# Patient Record
Sex: Female | Born: 1945
Health system: Southern US, Community
[De-identification: ages and names within clinical notes are randomized; demographics above are authoritative.]

---

## 1998-06-12 ENCOUNTER — Ambulatory Visit (HOSPITAL_COMMUNITY): Admission: RE | Admit: 1998-06-12 | Discharge: 1998-06-12 | Payer: Self-pay | Admitting: Obstetrics and Gynecology

## 1998-06-13 ENCOUNTER — Encounter: Payer: Self-pay | Admitting: Obstetrics and Gynecology

## 1998-09-11 ENCOUNTER — Other Ambulatory Visit: Admission: RE | Admit: 1998-09-11 | Discharge: 1998-09-11 | Payer: Self-pay | Admitting: Obstetrics and Gynecology

## 1999-03-22 ENCOUNTER — Ambulatory Visit (HOSPITAL_COMMUNITY): Admission: RE | Admit: 1999-03-22 | Discharge: 1999-03-22 | Payer: Self-pay | Admitting: Gastroenterology

## 1999-06-14 ENCOUNTER — Ambulatory Visit (HOSPITAL_COMMUNITY): Admission: RE | Admit: 1999-06-14 | Discharge: 1999-06-14 | Payer: Self-pay | Admitting: Obstetrics and Gynecology

## 1999-06-14 ENCOUNTER — Encounter: Payer: Self-pay | Admitting: Obstetrics and Gynecology

## 1999-09-17 ENCOUNTER — Other Ambulatory Visit: Admission: RE | Admit: 1999-09-17 | Discharge: 1999-09-17 | Payer: Self-pay | Admitting: Obstetrics and Gynecology

## 2000-06-25 ENCOUNTER — Ambulatory Visit (HOSPITAL_COMMUNITY): Admission: RE | Admit: 2000-06-25 | Discharge: 2000-06-25 | Payer: Self-pay | Admitting: Obstetrics and Gynecology

## 2000-06-25 ENCOUNTER — Encounter: Payer: Self-pay | Admitting: Obstetrics and Gynecology

## 2000-09-18 ENCOUNTER — Other Ambulatory Visit: Admission: RE | Admit: 2000-09-18 | Discharge: 2000-09-18 | Payer: Self-pay | Admitting: Obstetrics and Gynecology

## 2001-07-03 ENCOUNTER — Encounter: Payer: Self-pay | Admitting: Obstetrics and Gynecology

## 2001-07-03 ENCOUNTER — Ambulatory Visit (HOSPITAL_COMMUNITY): Admission: RE | Admit: 2001-07-03 | Discharge: 2001-07-03 | Payer: Self-pay | Admitting: Obstetrics and Gynecology

## 2001-09-28 ENCOUNTER — Other Ambulatory Visit: Admission: RE | Admit: 2001-09-28 | Discharge: 2001-09-28 | Payer: Self-pay | Admitting: Obstetrics and Gynecology

## 2002-07-05 ENCOUNTER — Ambulatory Visit (HOSPITAL_COMMUNITY): Admission: RE | Admit: 2002-07-05 | Discharge: 2002-07-05 | Payer: Self-pay | Admitting: Obstetrics and Gynecology

## 2002-07-05 ENCOUNTER — Encounter: Payer: Self-pay | Admitting: Obstetrics and Gynecology

## 2003-07-08 ENCOUNTER — Encounter: Payer: Self-pay | Admitting: Internal Medicine

## 2003-07-08 ENCOUNTER — Ambulatory Visit (HOSPITAL_COMMUNITY): Admission: RE | Admit: 2003-07-08 | Discharge: 2003-07-08 | Payer: Self-pay | Admitting: Internal Medicine

## 2004-02-10 ENCOUNTER — Ambulatory Visit (HOSPITAL_COMMUNITY): Admission: RE | Admit: 2004-02-10 | Discharge: 2004-02-10 | Payer: Self-pay | Admitting: Gastroenterology

## 2004-07-09 ENCOUNTER — Ambulatory Visit (HOSPITAL_COMMUNITY): Admission: RE | Admit: 2004-07-09 | Discharge: 2004-07-09 | Payer: Self-pay | Admitting: Internal Medicine

## 2005-07-10 ENCOUNTER — Ambulatory Visit (HOSPITAL_COMMUNITY): Admission: RE | Admit: 2005-07-10 | Discharge: 2005-07-10 | Payer: Self-pay | Admitting: Internal Medicine

## 2005-09-01 ENCOUNTER — Emergency Department (HOSPITAL_COMMUNITY): Admission: EM | Admit: 2005-09-01 | Discharge: 2005-09-02 | Payer: Self-pay | Admitting: Emergency Medicine

## 2006-07-11 ENCOUNTER — Ambulatory Visit (HOSPITAL_COMMUNITY): Admission: RE | Admit: 2006-07-11 | Discharge: 2006-07-11 | Payer: Self-pay | Admitting: Internal Medicine

## 2007-07-20 ENCOUNTER — Ambulatory Visit (HOSPITAL_COMMUNITY): Admission: RE | Admit: 2007-07-20 | Discharge: 2007-07-20 | Payer: Self-pay | Admitting: Internal Medicine

## 2008-07-21 ENCOUNTER — Ambulatory Visit (HOSPITAL_COMMUNITY): Admission: RE | Admit: 2008-07-21 | Discharge: 2008-07-21 | Payer: Self-pay | Admitting: Internal Medicine

## 2009-07-24 ENCOUNTER — Ambulatory Visit (HOSPITAL_COMMUNITY): Admission: RE | Admit: 2009-07-24 | Discharge: 2009-07-24 | Payer: Self-pay | Admitting: Internal Medicine

## 2010-07-26 ENCOUNTER — Ambulatory Visit (HOSPITAL_COMMUNITY): Admission: RE | Admit: 2010-07-26 | Discharge: 2010-07-26 | Payer: Self-pay | Admitting: Internal Medicine

## 2010-11-10 ENCOUNTER — Encounter: Payer: Self-pay | Admitting: Internal Medicine

## 2011-03-08 NOTE — Op Note (Signed)
NAME:  Dawn Wang, Dawn Wang                          ACCOUNT NO.:  [id number]   MEDICAL RECORD NO.:  [id number]                   PATIENT TYPE:  AMB   LOCATION:  ENDO                                 FACILITY:  [facility]   PHYSICIAN:  Anselmo Rod, M.D.               DATE OF BIRTH:  [birth year]   DATE OF PROCEDURE:  02/10/2004  DATE OF DISCHARGE:                                 OPERATIVE REPORT   PROCEDURE PERFORMED:  Screening colonoscopy.   ENDOSCOPIST:  Anselmo Rod, M.D.   INSTRUMENT USED:  Olympus video colonoscope.   INDICATIONS FOR PROCEDURE:  This is a 65 year old African-American female  underwent a repeat colonoscopy after five years for family history of colon  cancer, rule out colonic polyps or masses.   PREPROCEDURE PREPARATION:  Informed consent was procured from the patient.  The patient fasted for 8 hours prior to procedure, and prepped with a bottle  of magnesium citrate and a gallon of GoLYTELY the night prior to the  procedure.   PREPROCEDURE PHYSICAL:  The patient had stable vital signs.  Neck supple.  Chest clear to auscultation.  S1, S2 regular.  Abdomen soft with normal  bowel sounds.   DESCRIPTION OF PROCEDURE:  The patient was placed in the left lateral  decubitus position, sedated with 100 mg of Demerol and 10 mg of Versed  intravenously.  Once the patient was adequately sedated and maintained on  low flow oxygen and continuous cardiac monitoring, the Olympus video  colonoscope was advanced from the rectum to the cecum with difficulty.  The  patient's position was changed on the left lateral to the supine and the  right lateral position with application of abdominal pressure to reach the  cecum.  The appendiceal orifice and ileocecal valve were visualized and  photographed.  No masses or polyps were identified.  Small internal  hemorrhoids were seen on retroflexion in the rectum.  The patient tolerated  the procedure well without immediate complications.   IMPRESSION:  1. Very tortuous colon, no masses or polyps seen.  2. Small internal hemorrhoids seen on retroflexion.   RECOMMENDATIONS:  1. Continue high fiber diet with liberal fluid intake.  2. Repeat CRC screening in the next five years unless the patient develops     any abnormal symptoms in the interim.  3. Outpatient followup as need arises in the future.                                               Anselmo Rod, M.D.    JNM/MEDQ  D:  02/10/2004  T:  02/11/2004  Job:  [id number]   cc:   Merlene Laughter. Renae Gloss, M.D.  [address]  [address]  [place]  [place] [address]  Fax: [phone]

## 2011-06-21 ENCOUNTER — Other Ambulatory Visit (HOSPITAL_COMMUNITY): Payer: Self-pay | Admitting: Internal Medicine

## 2011-06-21 DIAGNOSIS — Z1231 Encounter for screening mammogram for malignant neoplasm of breast: Secondary | ICD-10-CM

## 2011-08-02 ENCOUNTER — Ambulatory Visit (HOSPITAL_COMMUNITY)
Admission: RE | Admit: 2011-08-02 | Discharge: 2011-08-02 | Disposition: A | Discharge status: Home / Self Care | Payer: BC Managed Care – PPO | Source: Ambulatory Visit | Attending: Internal Medicine | Admitting: Internal Medicine

## 2011-08-02 DIAGNOSIS — Z1231 Encounter for screening mammogram for malignant neoplasm of breast: Secondary | ICD-10-CM | POA: Insufficient documentation

## 2011-12-14 ENCOUNTER — Telehealth: Payer: Self-pay | Admitting: Family Medicine

## 2011-12-14 ENCOUNTER — Ambulatory Visit (INDEPENDENT_AMBULATORY_CARE_PROVIDER_SITE_OTHER): Payer: BC Managed Care – PPO | Admitting: Family Medicine

## 2011-12-14 DIAGNOSIS — J069 Acute upper respiratory infection, unspecified: Secondary | ICD-10-CM

## 2011-12-14 DIAGNOSIS — R05 Cough: Secondary | ICD-10-CM

## 2011-12-14 DIAGNOSIS — J45909 Unspecified asthma, uncomplicated: Secondary | ICD-10-CM

## 2011-12-14 DIAGNOSIS — R053 Chronic cough: Secondary | ICD-10-CM

## 2011-12-14 MED ORDER — ALBUTEROL SULFATE (2.5 MG/3ML) 0.083% IN NEBU
2.5000 mg | INHALATION_SOLUTION | Freq: Once | RESPIRATORY_TRACT | Status: AC
  Administered 2011-12-14: 2.5 mg via RESPIRATORY_TRACT

## 2011-12-14 MED ORDER — ALBUTEROL SULFATE HFA 108 (90 BASE) MCG/ACT IN AERS: 2.0000 | INHALATION_SPRAY | Freq: Four times a day (QID) | RESPIRATORY_TRACT | Status: DC | PRN

## 2011-12-14 MED ORDER — AZITHROMYCIN 1 G PO PACK: PACK | ORAL | Status: DC

## 2011-12-14 MED ORDER — HYDROCODONE-HOMATROPINE 5-1.5 MG/5ML PO SYRP: 5.0000 mL | ORAL_SOLUTION | Freq: Four times a day (QID) | ORAL | Status: AC | PRN

## 2011-12-14 MED ORDER — PREDNISONE 20 MG PO TABS: ORAL_TABLET | ORAL | Status: DC

## 2011-12-14 NOTE — Telephone Encounter (Signed)
Hycodan rx not given to patient, called into [facility] and Z pak rx corrected (was entered as powder).

## 2011-12-14 NOTE — Patient Instructions (Signed)
Use the medicines as instructed. If you're getting worse at anytime, or just not doing better, come back

## 2011-12-14 NOTE — Progress Notes (Signed)
Subjective: Patient is here complaining of having a respiratory tract infection for 2 weeks. Now she just continues to cough. She has rattling in her chest at night. She doesn't feel good. She is short of breath. He does cough up some phlegm. She is not running as much her nose now but she does some.  Objective: TMs are normal. Nose slightly congested. Throat clear. Neck supple without significant nodes. Chest has scattered rhonchi throughout both lungs and a and expiratory wheeze with poor air exchange on forced expiration.  Peak flow was done and her predicted is about 390, the best was 260.  Assessment: Acute asthmatic bronchitis Upper chart for infection  Plan: Will give handheld treatments while here in [place] she does. We'll plan to give her nebulizer and antibiotic therapy as well as cough syrup. We'll consider prednisone depending on how the nebulizer does.  Repeat peak flow was improved but not great. She still has some congestion. There are scattered rhonchi. Prescriptions

## 2012-06-29 ENCOUNTER — Other Ambulatory Visit (HOSPITAL_COMMUNITY): Payer: Self-pay | Admitting: Internal Medicine

## 2012-06-29 DIAGNOSIS — Z1231 Encounter for screening mammogram for malignant neoplasm of breast: Secondary | ICD-10-CM

## 2012-08-03 ENCOUNTER — Ambulatory Visit (HOSPITAL_COMMUNITY)
Admission: RE | Admit: 2012-08-03 | Discharge: 2012-08-03 | Disposition: A | Discharge status: Home / Self Care | Payer: BC Managed Care – PPO | Source: Ambulatory Visit | Attending: Internal Medicine | Admitting: Internal Medicine

## 2012-08-03 DIAGNOSIS — Z1231 Encounter for screening mammogram for malignant neoplasm of breast: Secondary | ICD-10-CM | POA: Insufficient documentation

## 2013-07-06 ENCOUNTER — Other Ambulatory Visit (HOSPITAL_COMMUNITY): Payer: Self-pay | Admitting: Internal Medicine

## 2013-07-06 DIAGNOSIS — Z1231 Encounter for screening mammogram for malignant neoplasm of breast: Secondary | ICD-10-CM

## 2013-08-04 ENCOUNTER — Ambulatory Visit (HOSPITAL_COMMUNITY)
Admission: RE | Admit: 2013-08-04 | Discharge: 2013-08-04 | Disposition: A | Discharge status: Home / Self Care | Payer: BC Managed Care – PPO | Source: Ambulatory Visit | Attending: Internal Medicine | Admitting: Internal Medicine

## 2013-08-04 DIAGNOSIS — Z1231 Encounter for screening mammogram for malignant neoplasm of breast: Secondary | ICD-10-CM | POA: Insufficient documentation

## 2014-08-11 ENCOUNTER — Other Ambulatory Visit (HOSPITAL_COMMUNITY): Payer: Self-pay | Admitting: Internal Medicine

## 2014-08-11 DIAGNOSIS — Z1231 Encounter for screening mammogram for malignant neoplasm of breast: Secondary | ICD-10-CM

## 2014-08-18 DIAGNOSIS — Z800 Family history of malignant neoplasm of digestive organs: Secondary | ICD-10-CM | POA: Diagnosis not present

## 2014-08-18 DIAGNOSIS — Z1211 Encounter for screening for malignant neoplasm of colon: Secondary | ICD-10-CM | POA: Diagnosis not present

## 2014-09-06 ENCOUNTER — Ambulatory Visit (HOSPITAL_COMMUNITY)
Admission: RE | Admit: 2014-09-06 | Discharge: 2014-09-06 | Disposition: A | Discharge status: Home / Self Care | Payer: Medicare Other | Source: Ambulatory Visit | Attending: Internal Medicine | Admitting: Internal Medicine

## 2014-09-06 DIAGNOSIS — Z1231 Encounter for screening mammogram for malignant neoplasm of breast: Secondary | ICD-10-CM | POA: Diagnosis not present

## 2014-09-13 DIAGNOSIS — Z23 Encounter for immunization: Secondary | ICD-10-CM | POA: Diagnosis not present

## 2014-10-24 DIAGNOSIS — Z1211 Encounter for screening for malignant neoplasm of colon: Secondary | ICD-10-CM | POA: Diagnosis not present

## 2014-10-24 DIAGNOSIS — D127 Benign neoplasm of rectosigmoid junction: Secondary | ICD-10-CM | POA: Diagnosis not present

## 2014-10-24 DIAGNOSIS — K621 Rectal polyp: Secondary | ICD-10-CM | POA: Diagnosis not present

## 2014-10-24 DIAGNOSIS — K635 Polyp of colon: Secondary | ICD-10-CM | POA: Diagnosis not present

## 2014-10-24 DIAGNOSIS — Z800 Family history of malignant neoplasm of digestive organs: Secondary | ICD-10-CM | POA: Diagnosis not present

## 2015-05-26 DIAGNOSIS — E780 Pure hypercholesterolemia: Secondary | ICD-10-CM | POA: Diagnosis not present

## 2015-05-26 DIAGNOSIS — Z0000 Encounter for general adult medical examination without abnormal findings: Secondary | ICD-10-CM | POA: Diagnosis not present

## 2015-05-26 DIAGNOSIS — E559 Vitamin D deficiency, unspecified: Secondary | ICD-10-CM | POA: Diagnosis not present

## 2015-05-26 DIAGNOSIS — R7309 Other abnormal glucose: Secondary | ICD-10-CM | POA: Diagnosis not present

## 2015-05-26 DIAGNOSIS — Z1389 Encounter for screening for other disorder: Secondary | ICD-10-CM | POA: Diagnosis not present

## 2015-05-29 ENCOUNTER — Ambulatory Visit
Admission: RE | Admit: 2015-05-29 | Discharge: 2015-05-29 | Disposition: A | Discharge status: Home / Self Care | Payer: Medicare Other | Source: Ambulatory Visit | Attending: Family | Admitting: Family

## 2015-05-29 ENCOUNTER — Other Ambulatory Visit: Payer: Self-pay | Admitting: Family

## 2015-05-29 DIAGNOSIS — M19011 Primary osteoarthritis, right shoulder: Secondary | ICD-10-CM | POA: Diagnosis not present

## 2015-05-29 DIAGNOSIS — M85411 Solitary bone cyst, right shoulder: Secondary | ICD-10-CM | POA: Diagnosis not present

## 2015-05-29 DIAGNOSIS — M85611 Other cyst of bone, right shoulder: Secondary | ICD-10-CM

## 2015-07-26 DIAGNOSIS — Z23 Encounter for immunization: Secondary | ICD-10-CM | POA: Diagnosis not present

## 2015-08-08 ENCOUNTER — Other Ambulatory Visit: Payer: Self-pay

## 2015-08-08 DIAGNOSIS — Z1231 Encounter for screening mammogram for malignant neoplasm of breast: Secondary | ICD-10-CM

## 2015-08-15 ENCOUNTER — Ambulatory Visit (INDEPENDENT_AMBULATORY_CARE_PROVIDER_SITE_OTHER): Payer: Medicare Other

## 2015-08-15 ENCOUNTER — Ambulatory Visit (INDEPENDENT_AMBULATORY_CARE_PROVIDER_SITE_OTHER): Payer: Medicare Other | Admitting: Physician Assistant

## 2015-08-15 VITALS — BP 146/80 | HR 85 | Temp 98.4°F | Resp 16 | Ht 61.0 in | Wt 160.0 lb

## 2015-08-15 DIAGNOSIS — R062 Wheezing: Secondary | ICD-10-CM

## 2015-08-15 DIAGNOSIS — R05 Cough: Secondary | ICD-10-CM

## 2015-08-15 DIAGNOSIS — J189 Pneumonia, unspecified organism: Secondary | ICD-10-CM | POA: Diagnosis not present

## 2015-08-15 DIAGNOSIS — R059 Cough, unspecified: Secondary | ICD-10-CM

## 2015-08-15 DIAGNOSIS — J181 Lobar pneumonia, unspecified organism: Secondary | ICD-10-CM

## 2015-08-15 LAB — POCT CBC
Granulocyte percent: 42.9 %G (ref 37–80)
HCT, POC: 36.9 % — AB (ref 37.7–47.9)
Hemoglobin: 12.3 g/dL (ref 12.2–16.2)
Lymph, poc: 3.3 (ref 0.6–3.4)
MCH, POC: 30.3 pg (ref 27–31.2)
MCHC: 33.2 g/dL (ref 31.8–35.4)
MCV: 91.3 fL (ref 80–97)
MID (cbc): 0.7 (ref 0–0.9)
MPV: 8.3 fL (ref 0–99.8)
POC Granulocyte: 3 (ref 2–6.9)
POC LYMPH PERCENT: 46.9 %L (ref 10–50)
POC MID %: 10.2 %M (ref 0–12)
Platelet Count, POC: 178 10*3/uL (ref 142–424)
RBC: 4.05 M/uL (ref 4.04–5.48)
RDW, POC: 13.7 %
WBC: 7.1 10*3/uL (ref 4.6–10.2)

## 2015-08-15 MED ORDER — ALBUTEROL SULFATE HFA 108 (90 BASE) MCG/ACT IN AERS: 2.0000 | INHALATION_SPRAY | Freq: Four times a day (QID) | RESPIRATORY_TRACT | Status: DC | PRN

## 2015-08-15 MED ORDER — GUAIFENESIN ER 1200 MG PO TB12: 1.0000 | ORAL_TABLET | Freq: Two times a day (BID) | ORAL | Status: AC

## 2015-08-15 MED ORDER — AZITHROMYCIN 250 MG PO TABS: ORAL_TABLET | ORAL | Status: AC

## 2015-08-15 MED ORDER — ALBUTEROL SULFATE (2.5 MG/3ML) 0.083% IN NEBU
2.5000 mg | INHALATION_SOLUTION | Freq: Once | RESPIRATORY_TRACT | Status: AC
  Administered 2015-08-15: 2.5 mg via RESPIRATORY_TRACT

## 2015-08-15 NOTE — Progress Notes (Signed)
Dawn Wang  MRN: [id number] DOB: [birth year]  Subjective:  Pt presents to clinic with cold symptoms for the last week and then over the last 3 days she has developed a cough that is coming from her throat that feels like a tickle that has some yellow sputum at times when she coughs but other times it is dry.  She is having no SOB or wheezing but she does think that when he takes a deep breath she is starting her cough.  Home treatment - cold preps and cough syrups - not much help  There are no active problems to display for this patient.   No current outpatient prescriptions on file prior to visit.   No current facility-administered medications on file prior to visit.    No Known Allergies  Review of Systems  Constitutional: Negative for fever and chills.  HENT: Positive for congestion (nasal - mild during the day and worse at night -) and postnasal drip. Rhinorrhea: clear.   Respiratory: Positive for cough (yellow). Negative for shortness of breath and wheezing.        No h/o asthma, nonsmoker  Gastrointestinal: Negative for nausea, vomiting and diarrhea.   Objective:  BP 146/80 mmHg  Pulse 85  Temp(Src) 98.4 F (36.9 C) (Oral)  Resp 16  Ht 5\' 1"  (1.549 m)  Wt 160 lb (72.576 kg)  BMI 30.25 kg/m2  SpO2 98%  Physical Exam  Constitutional: She is oriented to person, place, and time and well-developed, well-nourished, and in no distress.  HENT:  Head: Normocephalic and atraumatic.  Right Ear: Hearing, tympanic membrane, external ear and ear canal normal.  Left Ear: Hearing, tympanic membrane, external ear and ear canal normal.  Nose: Nose normal.  Mouth/Throat: Uvula is midline, oropharynx is clear and moist and mucous membranes are normal.  Eyes: Conjunctivae are normal.  Neck: Normal range of motion.  Cardiovascular: Normal rate, regular rhythm and normal heart sounds.   No murmur heard. Pulmonary/Chest: Effort normal. She has wheezes (rhonchi end inspiratory and  last 50% of expiratory and end expiratory wheezing -- after neb the rhonchi and wheezing were still present at the ends of both breathing cycles -  worse on the left lower aspect of lung exam).  Neurological: She is alert and oriented to person, place, and time. Gait normal.  Skin: Skin is warm and dry.  Psychiatric: Mood, memory, affect and judgment normal.  Vitals reviewed.  [facility] reading (PRIMARY) by  Dr. Elder Cyphers.  ? Infiltrate right lingular vs RML, Left mediastinal density  Results for orders placed or performed in visit on 08/15/15  POCT CBC  Result Value Ref Range   WBC 7.1 4.6 - 10.2 K/uL   Lymph, poc 3.3 0.6 - 3.4   POC LYMPH PERCENT 46.9 10 - 50 %L   MID (cbc) 0.7 0 - 0.9   POC MID % 10.2 0 - 12 %M   POC Granulocyte 3.0 2 - 6.9   Granulocyte percent 42.9 37 - 80 %G   RBC 4.05 4.04 - 5.48 M/uL   Hemoglobin 12.3 12.2 - 16.2 g/dL   HCT, POC 36.9 (A) 37.7 - 47.9 %   MCV 91.3 80 - 97 fL   MCH, POC 30.3 27 - 31.2 pg   MCHC 33.2 31.8 - 35.4 g/dL   RDW, POC 13.7 %   Platelet Count, POC 178 142 - 424 K/uL   MPV 8.3 0 - 99.8 fL    Assessment and Plan :  Wheezing -  Plan: albuterol (PROVENTIL) (2.5 MG/3ML) 0.083% nebulizer solution 2.5 mg, albuterol (PROVENTIL HFA;VENTOLIN HFA) 108 (90 BASE) MCG/ACT inhaler  Cough - Plan: albuterol (PROVENTIL) (2.5 MG/3ML) 0.083% nebulizer solution 2.5 mg, DG Chest 2 View, POCT CBC  RML pneumonia - Plan: azithromycin (ZITHROMAX Z-PAK) 250 MG tablet, Guaifenesin (MUCINEX MAXIMUM STRENGTH) 1200 MG TB12   Pt will recheck in 3-4 weeks for xray to make sure the PNA is resolved.  She will f/u sooner if she has problems.  D/w Dr Elder Cyphers  Windell Hummingbird PA-C  [facility] 08/15/2015 4:53 PM

## 2015-08-20 ENCOUNTER — Telehealth: Payer: Self-pay | Admitting: Physician Assistant

## 2015-08-20 NOTE — Telephone Encounter (Signed)
Patient would like to know if it is ok for her to go outside. Patient has been inside of home since Tuesday. Patient states she have walking pneumonia.

## 2015-08-21 NOTE — Telephone Encounter (Signed)
Pt notified that this is ok. She has taken all of the abx and is no longer contagious.

## 2015-08-28 ENCOUNTER — Encounter: Payer: Self-pay | Admitting: Family Medicine

## 2015-08-28 ENCOUNTER — Ambulatory Visit (INDEPENDENT_AMBULATORY_CARE_PROVIDER_SITE_OTHER): Payer: Medicare Other

## 2015-08-28 ENCOUNTER — Ambulatory Visit (INDEPENDENT_AMBULATORY_CARE_PROVIDER_SITE_OTHER): Payer: Medicare Other | Admitting: Family Medicine

## 2015-08-28 VITALS — BP 159/73 | HR 62 | Temp 98.1°F | Resp 16 | Ht 62.0 in | Wt 154.0 lb

## 2015-08-28 DIAGNOSIS — R05 Cough: Secondary | ICD-10-CM

## 2015-08-28 DIAGNOSIS — R059 Cough, unspecified: Secondary | ICD-10-CM

## 2015-08-28 DIAGNOSIS — R062 Wheezing: Secondary | ICD-10-CM

## 2015-08-28 DIAGNOSIS — R918 Other nonspecific abnormal finding of lung field: Secondary | ICD-10-CM

## 2015-08-28 MED ORDER — ALBUTEROL SULFATE (2.5 MG/3ML) 0.083% IN NEBU
2.5000 mg | INHALATION_SOLUTION | Freq: Once | RESPIRATORY_TRACT | Status: AC
  Administered 2015-08-28: 2.5 mg via RESPIRATORY_TRACT

## 2015-08-28 NOTE — Progress Notes (Signed)
Subjective:    Patient ID: Dawn Wang, female    DOB: 08/23/1946, 69 y.o.   MRN: [id number]  HPI This is a pleasant 69 yo female who presents today for follow up of recently diagnosed pneumonia. She was seen 08/15/15 and had CXR suggestive of pneumonia. She was given Zpack, albuterol inhaler and mucinex 12 hour. She has gotten progressively better following antibiotic, but continues to have intermittent cough. Energy level improving. Cough less frequent. She had a good night sleep last night for the first time in several weeks. Has been using albuterol inhaler 3x day with good relief of cough. Has not used this morning.   No past medical history on file. No past surgical history on file. No family history on file. Social History  Substance Use Topics  . Smoking status: Never Smoker   . Smokeless tobacco: None  . Alcohol Use: None   Review of Systems No fever or chills, no ear pain, no sore throat, continues to wheeze, but it is less frequent and relieved with albuterol. SOB with prolonged talking, able to do all normal household activities.     Objective:   Physical Exam  Constitutional: She is oriented to person, place, and time. She appears well-developed and well-nourished. No distress.  HENT:  Head: Normocephalic and atraumatic.  Right Ear: External ear normal.  Left Ear: External ear normal.  Nose: Nose normal.  Mouth/Throat: Oropharynx is clear and moist.  Eyes: Conjunctivae are normal. Pupils are equal, round, and reactive to light.  Neck: Normal range of motion. Neck supple.  Cardiovascular: Normal rate, regular rhythm and normal heart sounds.   Pulmonary/Chest: Effort normal.  Scattered rhonchi throughout anterior/posterior with high pitched squeaking. Occasional nonproductive cough.   Musculoskeletal: Normal range of motion.  Lymphadenopathy:    She has no cervical adenopathy.  Neurological: She is alert and oriented to person, place, and time.  Skin: Skin is warm  and dry. She is not diaphoretic.  Psychiatric: She has a normal mood and affect. Her behavior is normal. Thought content normal.  Vitals reviewed.  BP 159/73 mmHg  Pulse 62  Temp(Src) 98.1 F (36.7 C)  Resp 16  Ht 5\' 2"  (1.575 m)  Wt 154 lb (69.854 kg)  BMI 28.16 kg/m2  SpO2 98% Patient given albuterol neb in office with good results- subjective improvement and loosing of secretions with clear sputum production. Lungs with good air movement throughout and resolution of abnormal lung sounds.   [facility] reading (PRIMARY) by  Dr. Tamala Julian- CXR- right middle lobe pneumonia slightly improved. No additional abnormal findings.     Assessment & Plan:  Discussed with Dr. Tamala Julian who reviewed CXR.  1. Cough - albuterol (PROVENTIL) (2.5 MG/3ML) 0.083% nebulizer solution 2.5 mg; Take 3 mLs (2.5 mg total) by nebulization once. - DG Chest 2 View; Future - this has been slowly improving since finishing antibiotic  2. Wheeze - albuterol (PROVENTIL) (2.5 MG/3ML) 0.083% nebulizer solution 2.5 mg; Take 3 mLs (2.5 mg total) by nebulization once. - DG Chest 2 View; Future  3. Lung field abnormal finding on examination - albuterol (PROVENTIL) (2.5 MG/3ML) 0.083% nebulizer solution 2.5 mg; Take 3 mLs (2.5 mg total) by nebulization once. - DG Chest 2 View; Future  - Patient with good relief with albuterol neb in the office and evidence of improving pneumonia on CXR. Discussed with patient and instructed her to use albuterol inhaler every 4 hours while awake for the next 2-3 days, instructed her on proper use and  to wait 3-5 minutes between the first and second puff.  - Resume mucinex every 12 hours, hydrate until urine light yellow.  - Follow up as scheduled with Windell Hummingbird, PAC, 09/05/15 - RTC if any fever, SOB, worsening symptoms (cough, wheeze)  Clarene Reamer, FNP-BC  [facility]  08/28/2015 12:53 PM

## 2015-08-28 NOTE — Patient Instructions (Signed)
Use your albuterol inhaler every 4 hours while awake for the next 2-3 days Take mucinex for next couple of days. Drink lots of water.

## 2015-09-05 ENCOUNTER — Ambulatory Visit (INDEPENDENT_AMBULATORY_CARE_PROVIDER_SITE_OTHER): Payer: Medicare Other

## 2015-09-05 ENCOUNTER — Encounter: Payer: Self-pay | Admitting: Physician Assistant

## 2015-09-05 ENCOUNTER — Ambulatory Visit (INDEPENDENT_AMBULATORY_CARE_PROVIDER_SITE_OTHER): Payer: Medicare Other | Admitting: Physician Assistant

## 2015-09-05 VITALS — BP 150/76 | HR 69 | Temp 98.9°F | Resp 16 | Ht 62.5 in | Wt 155.0 lb

## 2015-09-05 DIAGNOSIS — J181 Lobar pneumonia, unspecified organism: Secondary | ICD-10-CM

## 2015-09-05 DIAGNOSIS — R030 Elevated blood-pressure reading, without diagnosis of hypertension: Secondary | ICD-10-CM

## 2015-09-05 DIAGNOSIS — R918 Other nonspecific abnormal finding of lung field: Secondary | ICD-10-CM | POA: Diagnosis not present

## 2015-09-05 DIAGNOSIS — J189 Pneumonia, unspecified organism: Secondary | ICD-10-CM | POA: Diagnosis not present

## 2015-09-05 DIAGNOSIS — IMO0001 Reserved for inherently not codable concepts without codable children: Secondary | ICD-10-CM

## 2015-09-05 NOTE — Progress Notes (Signed)
   Dawn Wang  MRN: [id number] DOB: December 22, 1945  Subjective:  Pt presents to clinic for recheck PNA and repeat xray.  She finally over the last week feels back to normal.  She used the inhaler last wed for the last time and has not felt like she needed it again.  She is having no SOB or cough or wheezing.  There are no active problems to display for this patient.   No current outpatient prescriptions on file prior to visit.   No current facility-administered medications on file prior to visit.    No Known Allergies  Review of Systems  Constitutional: Negative for fever and chills.  Respiratory: Negative for cough, shortness of breath and wheezing.    Objective:  BP 150/76 mmHg  Pulse 69  Temp(Src) 98.9 F (37.2 C) (Oral)  Resp 16  Ht 5' 2.5" (1.588 m)  Wt 155 lb (70.308 kg)  BMI 27.88 kg/m2  SpO2 96%  Physical Exam  Constitutional: She is oriented to person, place, and time and well-developed, well-nourished, and in no distress.  HENT:  Head: Normocephalic and atraumatic.  Right Ear: Hearing and external ear normal.  Left Ear: Hearing and external ear normal.  Eyes: Conjunctivae are normal.  Neck: Normal range of motion.  Cardiovascular: Normal rate, regular rhythm and normal heart sounds.   No murmur heard. Pulmonary/Chest: Effort normal and breath sounds normal. She has no wheezes.  Neurological: She is alert and oriented to person, place, and time. Gait normal.  Skin: Skin is warm and dry.  Psychiatric: Mood, memory, affect and judgment normal.  Vitals reviewed.   [facility] reading (PRIMARY) by  Dr. Everlene Farrier.  Neg - resolved PNA  Assessment and Plan :  Lung field abnormal finding on examination - Plan: DG Chest 2 View  RML pneumonia - Plan: DG Chest 2 View - resolved  Elevated BP - pt to f/u with me or her PCP to make sure it does not stay elevated   Windell Hummingbird PA-C  [facility] 09/05/2015 10:52 AM

## 2015-09-11 ENCOUNTER — Ambulatory Visit: Payer: Medicare Other

## 2015-09-11 ENCOUNTER — Ambulatory Visit
Admission: RE | Admit: 2015-09-11 | Discharge: 2015-09-11 | Disposition: A | Discharge status: Home / Self Care | Payer: Medicare Other | Source: Ambulatory Visit

## 2015-09-11 DIAGNOSIS — Z1231 Encounter for screening mammogram for malignant neoplasm of breast: Secondary | ICD-10-CM | POA: Diagnosis not present

## 2015-09-19 NOTE — Progress Notes (Signed)
History and physical examinations reviewed in detail with FNP Carlean Purl.  CXR reviewed during visit. Agree with assessment and plan. Taison Celani Elayne Guerin, M.D. [facility] [address] [place], [place]  [address] [phone] phone [phone] fax

## 2016-01-03 IMAGING — CR DG CHEST 2V
2 series · 2 of 2 positions shown · non-contrast
Comparison: August 28, 2015.

CLINICAL DATA: Lung field abnormality on examination. Possible
pneumonia.

EXAM:
CHEST  2 VIEW

[PA]
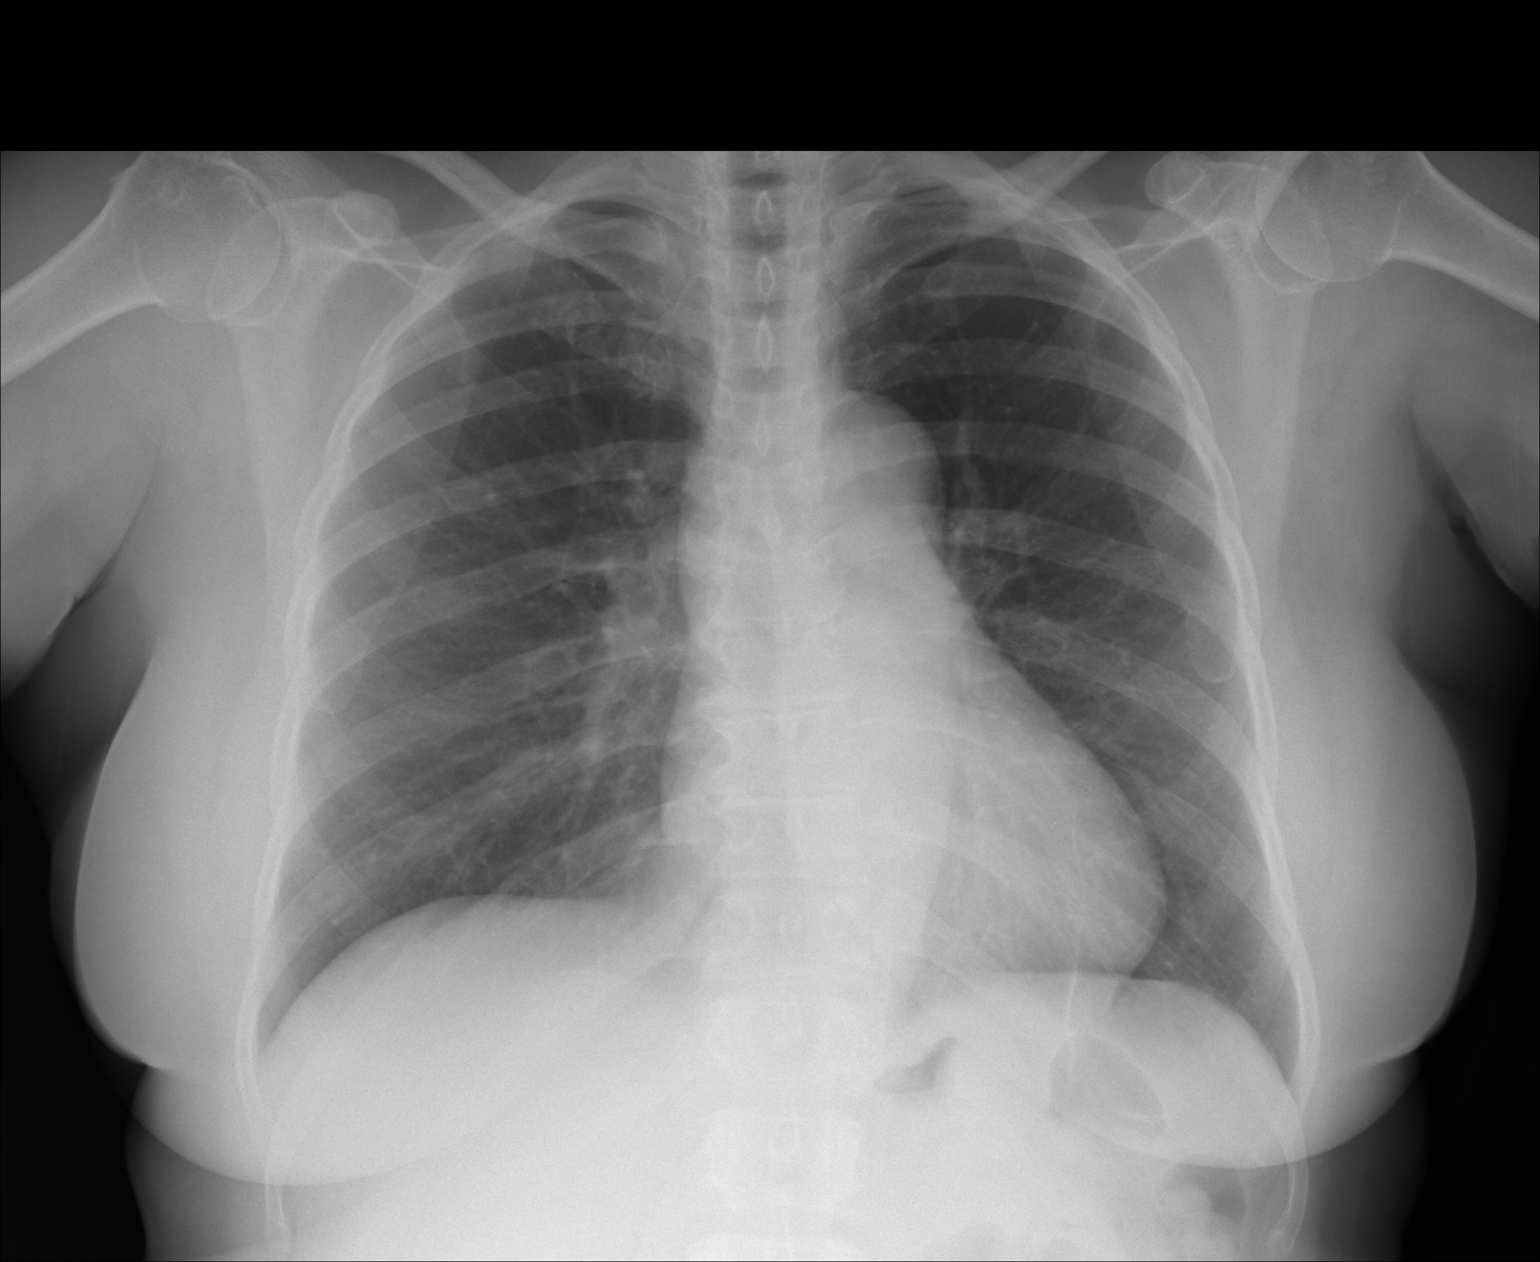

[lateral]
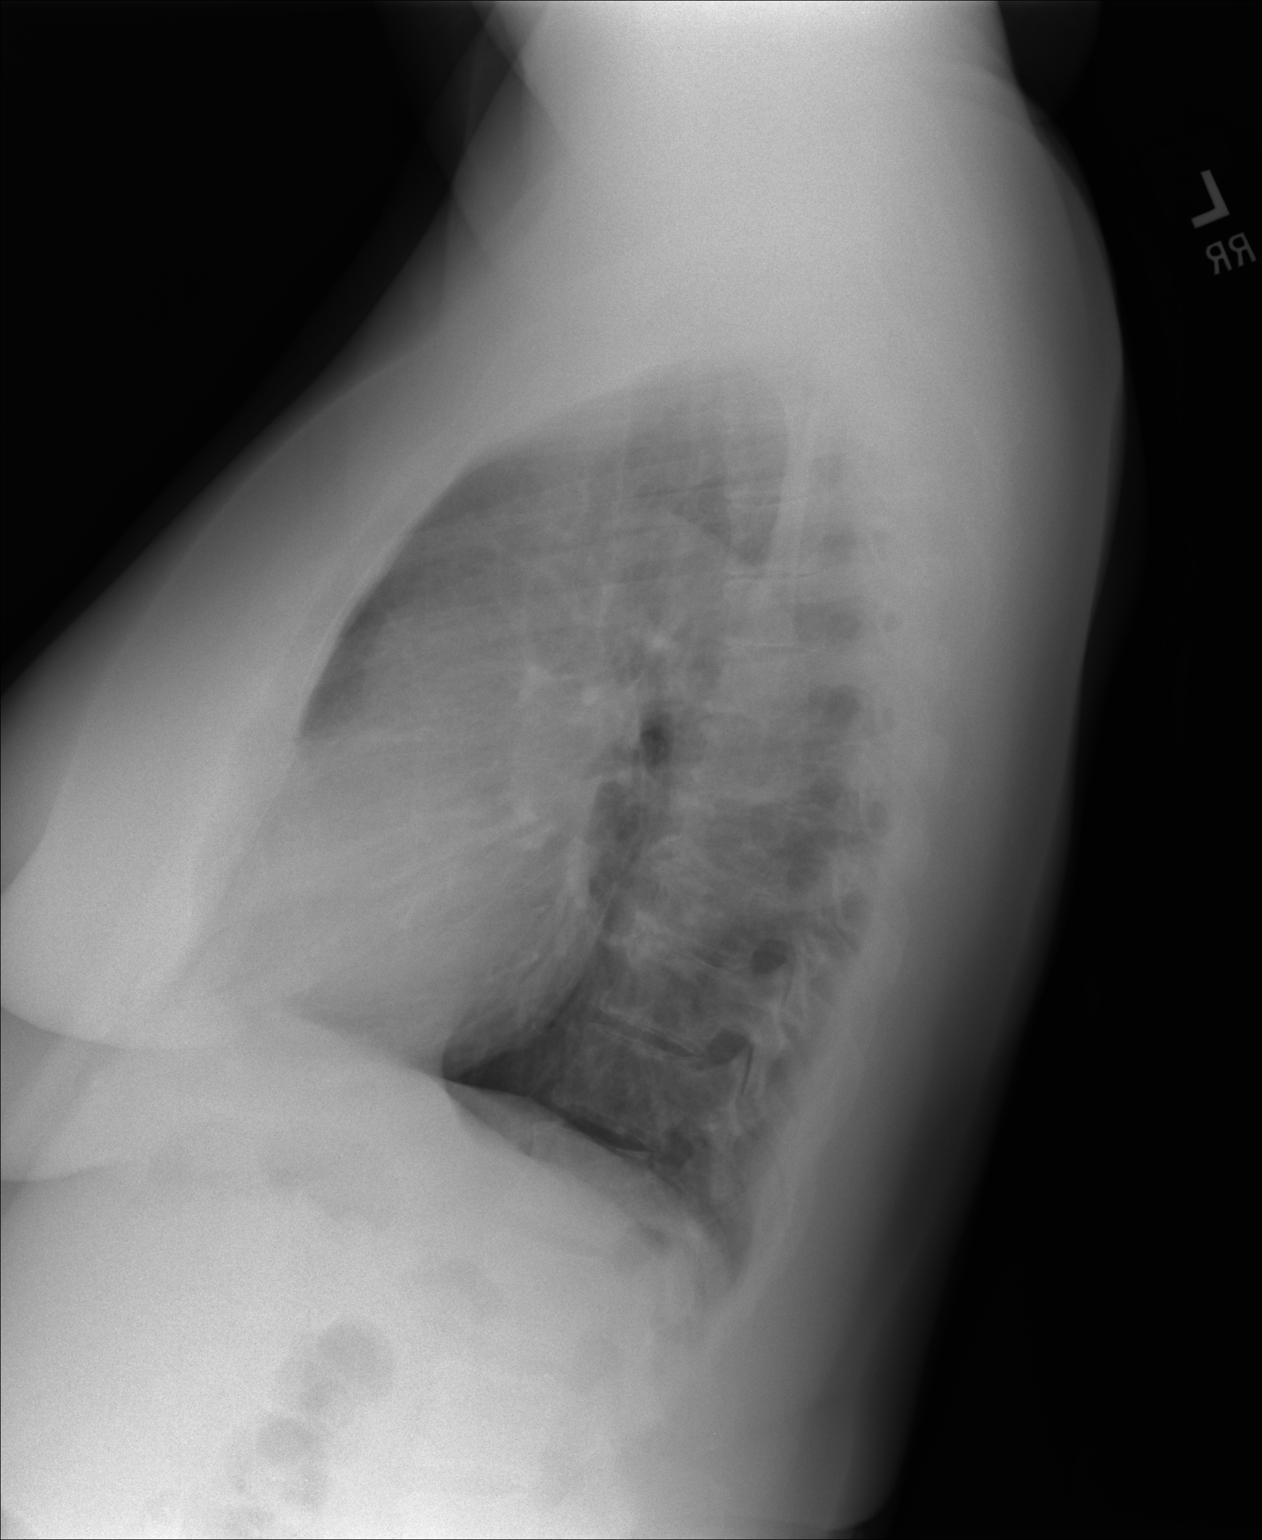

[2 of 2 positions shown; findings below may reference images not displayed]

FINDINGS: The heart size and mediastinal contours are within normal limits.
Both lungs are clear. The visualized skeletal structures are
unremarkable.
IMPRESSION: No active cardiopulmonary disease.

## 2016-06-03 DIAGNOSIS — H6121 Impacted cerumen, right ear: Secondary | ICD-10-CM | POA: Diagnosis not present

## 2016-06-03 DIAGNOSIS — H6123 Impacted cerumen, bilateral: Secondary | ICD-10-CM | POA: Diagnosis not present

## 2016-06-03 DIAGNOSIS — E559 Vitamin D deficiency, unspecified: Secondary | ICD-10-CM | POA: Diagnosis not present

## 2016-06-03 DIAGNOSIS — H6122 Impacted cerumen, left ear: Secondary | ICD-10-CM | POA: Diagnosis not present

## 2016-06-03 DIAGNOSIS — E7800 Pure hypercholesterolemia, unspecified: Secondary | ICD-10-CM | POA: Diagnosis not present

## 2016-06-03 DIAGNOSIS — R7303 Prediabetes: Secondary | ICD-10-CM | POA: Diagnosis not present

## 2016-06-03 DIAGNOSIS — Z0000 Encounter for general adult medical examination without abnormal findings: Secondary | ICD-10-CM | POA: Diagnosis not present

## 2016-07-11 DIAGNOSIS — Z23 Encounter for immunization: Secondary | ICD-10-CM | POA: Diagnosis not present

## 2016-07-17 ENCOUNTER — Other Ambulatory Visit: Payer: Self-pay | Admitting: Internal Medicine

## 2016-07-17 DIAGNOSIS — Z1231 Encounter for screening mammogram for malignant neoplasm of breast: Secondary | ICD-10-CM

## 2016-09-11 ENCOUNTER — Ambulatory Visit
Admission: RE | Admit: 2016-09-11 | Discharge: 2016-09-11 | Disposition: A | Discharge status: Home / Self Care | Payer: Medicare Other | Source: Ambulatory Visit | Attending: Internal Medicine | Admitting: Internal Medicine

## 2016-09-11 DIAGNOSIS — Z1231 Encounter for screening mammogram for malignant neoplasm of breast: Secondary | ICD-10-CM

## 2017-07-21 ENCOUNTER — Other Ambulatory Visit: Payer: Self-pay | Admitting: Internal Medicine

## 2017-07-21 DIAGNOSIS — Z1231 Encounter for screening mammogram for malignant neoplasm of breast: Secondary | ICD-10-CM

## 2017-07-22 DIAGNOSIS — Z23 Encounter for immunization: Secondary | ICD-10-CM | POA: Diagnosis not present

## 2017-09-15 ENCOUNTER — Ambulatory Visit
Admission: RE | Admit: 2017-09-15 | Discharge: 2017-09-15 | Disposition: A | Discharge status: Home / Self Care | Payer: Medicare Other | Source: Ambulatory Visit | Attending: Internal Medicine | Admitting: Internal Medicine

## 2017-09-15 DIAGNOSIS — Z1231 Encounter for screening mammogram for malignant neoplasm of breast: Secondary | ICD-10-CM

## 2017-10-31 ENCOUNTER — Other Ambulatory Visit: Payer: Self-pay | Admitting: Internal Medicine

## 2017-10-31 DIAGNOSIS — E2839 Other primary ovarian failure: Secondary | ICD-10-CM

## 2017-11-25 ENCOUNTER — Ambulatory Visit
Admission: RE | Admit: 2017-11-25 | Discharge: 2017-11-25 | Disposition: A | Discharge status: Home / Self Care | Payer: Medicare Other | Source: Ambulatory Visit | Attending: Internal Medicine | Admitting: Internal Medicine

## 2017-11-25 DIAGNOSIS — E2839 Other primary ovarian failure: Secondary | ICD-10-CM

## 2018-08-10 ENCOUNTER — Other Ambulatory Visit: Payer: Self-pay | Admitting: Internal Medicine

## 2018-08-10 DIAGNOSIS — Z1231 Encounter for screening mammogram for malignant neoplasm of breast: Secondary | ICD-10-CM

## 2018-09-15 ENCOUNTER — Encounter (INDEPENDENT_AMBULATORY_CARE_PROVIDER_SITE_OTHER): Payer: Self-pay

## 2018-09-15 ENCOUNTER — Ambulatory Visit: Payer: Medicare Other

## 2018-09-15 VITALS — BP 138/80 | HR 64 | Temp 97.9°F | Ht 60.0 in | Wt 151.0 lb

## 2018-09-15 DIAGNOSIS — Z0000 Encounter for general adult medical examination without abnormal findings: Secondary | ICD-10-CM | POA: Diagnosis not present

## 2018-09-15 MED ORDER — ZOSTER VAC RECOMB ADJUVANTED 50 MCG/0.5ML IM SUSR: 0.5000 mL | Freq: Once | INTRAMUSCULAR | 1 refills | Status: AC

## 2018-09-15 NOTE — Progress Notes (Signed)
Subjective:   Dawn Wang is a 72 y.o. female who presents for Medicare Annual (Subsequent) preventive examination.       Objective:     Vitals: BP 138/80 (BP Location: Left Arm, Patient Position: Sitting)   Pulse 64   Temp 97.9 F (36.6 C) (Oral)   Ht 5' (1.524 m)   Wt 151 lb (68.5 kg)   SpO2 98%   BMI 29.49 kg/m   Body mass index is 29.49 kg/m.  Advanced Directives 09/15/2018  Does Patient Have a Medical Advance Directive? No  Would patient like information on creating a medical advance directive? Yes (MAU/Ambulatory/Procedural Areas - Information given)    Tobacco Social History   Tobacco Use  Smoking Status Never Smoker  Smokeless Tobacco Never Used     Counseling given: Not Answered   Clinical Intake:  Pre-visit preparation completed: No  Pain : No/denies pain     Diabetes: No  How often do you need to have someone help you when you read instructions, pamphlets, or other written materials from your doctor or pharmacy?: 1 - Never What is the last grade level you completed in school?: Masters  Interpreter Needed?: No  Information entered by :: Tyson Dense, RN  History reviewed. No pertinent past medical history. History reviewed. No pertinent surgical history. Family History  Problem Relation Age of Onset  . Cancer Mother   . Diabetes Father   . Hypertension Father    Social History   Socioeconomic History  . Marital status: Married    Spouse name: Not on file  . Number of children: Not on file  . Years of education: Not on file  . Highest education level: Not on file  Occupational History  . Not on file  Social Needs  . Financial resource strain: Not hard at all  . Food insecurity:    Worry: Never true    Inability: Never true  . Transportation needs:    Medical: No    Non-medical: No  Tobacco Use  . Smoking status: Never Smoker  . Smokeless tobacco: Never Used  Substance and Sexual Activity  . Alcohol use: Never   Frequency: Never  . Drug use: Not on file  . Sexual activity: Not on file  Lifestyle  . Physical activity:    Days per week: 0 days    Minutes per session: 0 min  . Stress: Not at all  Relationships  . Social connections:    Talks on phone: More than three times a week    Gets together: Once a week    Attends religious service: More than 4 times per year    Active member of club or organization: No    Attends meetings of clubs or organizations: Never    Relationship status: Married  Other Topics Concern  . Not on file  Social History Narrative  . Not on file    No outpatient encounter medications on file as of 09/15/2018.   No facility-administered encounter medications on file as of 09/15/2018.     Activities of Daily Living In your present state of health, do you have any difficulty performing the following activities: 09/15/2018  Hearing? N  Vision? N  Difficulty concentrating or making decisions? N  Walking or climbing stairs? N  Dressing or bathing? N  Doing errands, shopping? N  Preparing Food and eating ? N  Using the Toilet? N  In the past six months, have you accidently leaked urine? N  Do you  have problems with loss of bowel control? N  Managing your Medications? N  Managing your Finances? N  Housekeeping or managing your Housekeeping? N  Some recent data might be hidden    Patient Care Team: Glendale Chard, MD as PCP - General (Internal Medicine)    Assessment:   This is a routine wellness examination for [place].  Exercise Activities and Dietary recommendations Current Exercise Habits: The patient does not participate in regular exercise at present, Exercise limited by: None identified  Goals   None     Fall Risk Fall Risk  09/15/2018 08/28/2015  Falls in the past year? 0 No  Number falls in past yr: 0 -  Injury with Fall? 0 -   Is the patient's home free of loose throw rugs in walkways, pet beds, electrical cords, etc?   yes      Grab bars  in the bathroom? no      Handrails on the stairs?   yes      Adequate lighting?   yes  Depression Screen PHQ 2/9 Scores 09/15/2018 09/05/2015 08/28/2015 08/15/2015  PHQ - 2 Score 0 0 0 0     Cognitive Function     6CIT Screen 09/15/2018  What Year? 0 points  What month? 0 points  What time? 0 points  Count back from 20 0 points  Months in reverse 0 points  Repeat phrase 0 points  Total Score 0     There is no immunization history on file for this patient.  Qualifies for Shingles Vaccine? Yes, educated and ordered to pharmacy  Screening Tests Health Maintenance  Topic Date Due  . Hepatitis C Screening  05-Dec-1945  . COLONOSCOPY  12/26/1997  . PNA vac Low Risk Adult (1 of 2 - PCV13) 12/26/2012  . INFLUENZA VACCINE  05/21/2018  . MAMMOGRAM  09/16/2019  . TETANUS/TDAP  10/21/2022  . DEXA SCAN  Completed    Cancer Screenings: Lung: Low Dose CT Chest recommended if Age 52-80 years, 30 pack-year currently smoking OR have quit w/in 15years. Patient does not qualify. Breast:  Up to date on Mammogram? Yes   Up to date of Bone Density/Dexa? Yes Colorectal: up to date, patient stated her last one was in 2020 and they are every 5 years  Additional Screenings:  Hepatitis C Screening: declined Pneumonia shot: possible due will check with insurance    Plan:    I have personally reviewed and addressed the Medicare Annual Wellness questionnaire and have noted the following in the patient's chart:  A. Medical and social history B. Use of alcohol, tobacco or illicit drugs  C. Current medications and supplements D. Functional ability and status E.  Nutritional status F.  Physical activity G. Advance directives H. List of other physicians I.  Hospitalizations, surgeries, and ER visits in previous 12 months J.  [place] to include hearing, vision, cognitive, depression L. Referrals and appointments - none  In addition, I have reviewed and discussed with patient  certain preventive protocols, quality metrics, and best practice recommendations. A written personalized care plan for preventive services as well as general preventive health recommendations were provided to patient.  See attached scanned questionnaire for additional information.   Signed,   Tyson Dense, RN Nurse Health Advisor  Patient Concerns: L heel gets sore sometimes

## 2018-09-15 NOTE — Patient Instructions (Signed)
Dawn Wang , Thank you for taking time to come for your Medicare Wellness Visit. I appreciate your ongoing commitment to your health goals. Please review the following plan we discussed and let me know if I can assist you in the future.   Screening recommendations/referrals: Colonoscopy up to date, due 03/11/2022 Mammogram up to date, due 09/16/2019 Bone Density up to date Recommended yearly ophthalmology/optometry visit for glaucoma screening and checkup Recommended yearly dental visit for hygiene and checkup  Vaccinations: Influenza vaccine up to date Pneumococcal vaccine. Please see if/when you had Prevnar 13 or Pneumovax 23 Tdap vaccine up to date, due 05/20/2024 Shingles vaccine due, prescription sent to pharmacy    Advanced directives: Advance directive discussed with you today. I have provided a copy for you to complete at home and have notarized. Once this is complete please bring a copy in to our office so we can scan it into your chart.  Conditions/risks identified: none  Next appointment: Dawn Wang 10/28/2018 @10am    Preventive Care 65 Years and Older, Female Preventive care refers to lifestyle choices and visits with your health care provider that can promote health and wellness. What does preventive care include?  A yearly physical exam. This is also called an annual well check.  Dental exams once or twice a year.  Routine eye exams. Ask your health care provider how often you should have your eyes checked.  Personal lifestyle choices, including:  Daily care of your teeth and gums.  Regular physical activity.  Eating a healthy diet.  Avoiding tobacco and drug use.  Limiting alcohol use.  Practicing safe sex.  Taking low-dose aspirin every day.  Taking vitamin and mineral supplements as recommended by your health care provider. What happens during an annual well check? The services and screenings done by your health care provider during your annual well check will  depend on your age, overall health, lifestyle risk factors, and family history of disease. Counseling  Your health care provider may ask you questions about your:  Alcohol use.  Tobacco use.  Drug use.  Emotional well-being.  Home and relationship well-being.  Sexual activity.  Eating habits.  History of falls.  Memory and ability to understand (cognition).  Work and work Statistician.  Reproductive health. Screening  You may have the following tests or measurements:  Height, weight, and BMI.  Blood pressure.  Lipid and cholesterol levels. These may be checked every 5 years, or more frequently if you are over 30 years old.  Skin check.  Lung cancer screening. You may have this screening every year starting at age 59 if you have a 30-pack-year history of smoking and currently smoke or have quit within the past 15 years.  Fecal occult blood test (FOBT) of the stool. You may have this test every year starting at age 11.  Flexible sigmoidoscopy or colonoscopy. You may have a sigmoidoscopy every 5 years or a colonoscopy every 10 years starting at age 41.  Hepatitis C blood test.  Hepatitis B blood test.  Sexually transmitted disease (STD) testing.  Diabetes screening. This is done by checking your blood sugar (glucose) after you have not eaten for a while (fasting). You may have this done every 1-3 years.  Bone density scan. This is done to screen for osteoporosis. You may have this done starting at age 13.  Mammogram. This may be done every 1-2 years. Talk to your health care provider about how often you should have regular mammograms. Talk with your health care  provider about your test results, treatment options, and if necessary, the need for more tests. Vaccines  Your health care provider may recommend certain vaccines, such as:  Influenza vaccine. This is recommended every year.  Tetanus, diphtheria, and acellular pertussis (Tdap, Td) vaccine. You may need a  Td booster every 10 years.  Zoster vaccine. You may need this after age 35.  Pneumococcal 13-valent conjugate (PCV13) vaccine. One dose is recommended after age 81.  Pneumococcal polysaccharide (PPSV23) vaccine. One dose is recommended after age 6. Talk to your health care provider about which screenings and vaccines you need and how often you need them. This information is not intended to replace advice given to you by your health care provider. Make sure you discuss any questions you have with your health care provider. Document Released: 11/03/2015 Document Revised: 06/26/2016 Document Reviewed: 08/08/2015 Elsevier Interactive Patient Education  2017 [place] Prevention in the Home Falls can cause injuries. They can happen to people of all ages. There are many things you can do to make your home safe and to help prevent falls. What can I do on the outside of my home?  Regularly fix the edges of walkways and driveways and fix any cracks.  Remove anything that might make you trip as you walk through a door, such as a raised step or threshold.  Trim any bushes or trees on the path to your home.  Use bright outdoor lighting.  Clear any walking paths of anything that might make someone trip, such as rocks or tools.  Regularly check to see if handrails are loose or broken. Make sure that both sides of any steps have handrails.  Any raised decks and porches should have guardrails on the edges.  Have any leaves, snow, or ice cleared regularly.  Use sand or salt on walking paths during winter.  Clean up any spills in your garage right away. This includes oil or grease spills. What can I do in the bathroom?  Use night lights.  Install grab bars by the toilet and in the tub and shower. Do not use towel bars as grab bars.  Use non-skid mats or decals in the tub or shower.  If you need to sit down in the shower, use a plastic, non-slip stool.  Keep the floor dry. Clean  up any water that spills on the floor as soon as it happens.  Remove soap buildup in the tub or shower regularly.  Attach bath mats securely with double-sided non-slip rug tape.  Do not have throw rugs and other things on the floor that can make you trip. What can I do in the bedroom?  Use night lights.  Make sure that you have a light by your bed that is easy to reach.  Do not use any sheets or blankets that are too big for your bed. They should not hang down onto the floor.  Have a firm chair that has side arms. You can use this for support while you get dressed.  Do not have throw rugs and other things on the floor that can make you trip. What can I do in the kitchen?  Clean up any spills right away.  Avoid walking on wet floors.  Keep items that you use a lot in easy-to-reach places.  If you need to reach something above you, use a strong step stool that has a grab bar.  Keep electrical cords out of the way.  Do not use floor polish  or wax that makes floors slippery. If you must use wax, use non-skid floor wax.  Do not have throw rugs and other things on the floor that can make you trip. What can I do with my stairs?  Do not leave any items on the stairs.  Make sure that there are handrails on both sides of the stairs and use them. Fix handrails that are broken or loose. Make sure that handrails are as long as the stairways.  Check any carpeting to make sure that it is firmly attached to the stairs. Fix any carpet that is loose or worn.  Avoid having throw rugs at the top or bottom of the stairs. If you do have throw rugs, attach them to the floor with carpet tape.  Make sure that you have a light switch at the top of the stairs and the bottom of the stairs. If you do not have them, ask someone to add them for you. What else can I do to help prevent falls?  Wear shoes that:  Do not have high heels.  Have rubber bottoms.  Are comfortable and fit you well.  Are  closed at the toe. Do not wear sandals.  If you use a stepladder:  Make sure that it is fully opened. Do not climb a closed stepladder.  Make sure that both sides of the stepladder are locked into place.  Ask someone to hold it for you, if possible.  Clearly [place] and make sure that you can see:  Any grab bars or handrails.  First and last steps.  Where the edge of each step is.  Use tools that help you move around (mobility aids) if they are needed. These include:  Canes.  Walkers.  Scooters.  Crutches.  Turn on the lights when you go into a dark area. Replace any light bulbs as soon as they burn out.  Set up your furniture so you have a clear path. Avoid moving your furniture around.  If any of your floors are uneven, fix them.  If there are any pets around you, be aware of where they are.  Review your medicines with your doctor. Some medicines can make you feel dizzy. This can increase your chance of falling. Ask your doctor what other things that you can do to help prevent falls. This information is not intended to replace advice given to you by your health care provider. Make sure you discuss any questions you have with your health care provider. Document Released: 08/03/2009 Document Revised: 03/14/2016 Document Reviewed: 11/11/2014 Elsevier Interactive Patient Education  2017 [employer].

## 2018-09-22 ENCOUNTER — Ambulatory Visit
Admission: RE | Admit: 2018-09-22 | Discharge: 2018-09-22 | Disposition: A | Discharge status: Home / Self Care | Payer: Medicare Other | Source: Ambulatory Visit | Attending: Internal Medicine | Admitting: Internal Medicine

## 2018-09-22 DIAGNOSIS — Z1231 Encounter for screening mammogram for malignant neoplasm of breast: Secondary | ICD-10-CM

## 2018-10-28 ENCOUNTER — Ambulatory Visit: Payer: Medicare Other | Admitting: Nurse Practitioner

## 2018-10-28 ENCOUNTER — Ambulatory Visit: Payer: Medicare Other

## 2018-10-28 ENCOUNTER — Encounter: Payer: Self-pay | Admitting: Nurse Practitioner

## 2018-10-28 VITALS — BP 130/76 | HR 66 | Temp 97.7°F | Ht 60.8 in | Wt 152.8 lb

## 2018-10-28 DIAGNOSIS — R7303 Prediabetes: Secondary | ICD-10-CM | POA: Insufficient documentation

## 2018-10-28 DIAGNOSIS — E782 Mixed hyperlipidemia: Secondary | ICD-10-CM | POA: Diagnosis not present

## 2018-10-28 DIAGNOSIS — Z0000 Encounter for general adult medical examination without abnormal findings: Secondary | ICD-10-CM | POA: Diagnosis not present

## 2018-10-28 DIAGNOSIS — Z1159 Encounter for screening for other viral diseases: Secondary | ICD-10-CM

## 2018-10-28 MED ORDER — PNEUMOCOCCAL 13-VAL CONJ VACC IM SUSP: 0.5000 mL | INTRAMUSCULAR | 0 refills | Status: AC

## 2018-10-28 NOTE — Progress Notes (Addendum)
Subjective:     Patient ID: Dawn Wang , female    DOB: 05/22/1946 , 73 y.o.   MRN: [id number]   Chief Complaint  Patient presents with  . Annual Exam   The patient states she uses status post hysterectomy for birth control. Last LMP was No LMP recorded. Patient has had a hysterectomy.. Negative for Dysmenorrhea and Negative for Menorrhagia Mammogram last done 09/15/2017 Negative for: breast discharge, breast lump(s), breast pain and breast self exam.  Pertinent negatives include abnormal bleeding (hematology), anxiety, decreased libido, depression, difficulty falling sleep, dyspareunia, history of infertility, nocturia, sexual dysfunction, sleep disturbances, urinary incontinence, urinary urgency, vaginal discharge and vaginal itching. Diet regular.The patient states her exercise level is  minimal   . The patient's tobacco use is:  Social History   Tobacco Use  Smoking Status Never Smoker  Smokeless Tobacco Never Used  . She has been exposed to passive smoke. The patient's alcohol use is:  Social History   Substance and Sexual Activity  Alcohol Use Never  . Frequency: Never  . Additional information: Last pap has GYN HPI  Diabetes  She presents for her follow-up diabetic visit. Diabetes type: prediabetes. There are no hypoglycemic associated symptoms. Pertinent negatives for hypoglycemia include no confusion or dizziness. Pertinent negatives for diabetes include no blurred vision, no chest pain, no fatigue, no polydipsia, no polyphagia and no polyuria. There are no hypoglycemic complications. Symptoms are stable. When asked about current treatments, none were reported. Eye exam is current.     No past medical history on file.   Family History  Problem Relation Age of Onset  . Cancer Mother   . Diabetes Father   . Hypertension Father     No current outpatient medications on file.   No Known Allergies   Review of Systems  Constitutional: Negative.  Negative for fatigue.   Eyes: Negative.  Negative for blurred vision.  Respiratory: Negative.   Cardiovascular: Negative.  Negative for chest pain, palpitations and leg swelling.  Gastrointestinal: Negative.   Endocrine: Negative.  Negative for polydipsia, polyphagia and polyuria.  Musculoskeletal: Negative.   Skin: Negative.   Neurological: Negative.  Negative for dizziness.  Psychiatric/Behavioral: Negative for confusion.     Today's Vitals   10/28/18 1000  BP: 130/76  Pulse: 66  Temp: 97.7 F (36.5 C)  TempSrc: Oral  SpO2: 97%  Weight: 152 lb 12.8 oz (69.3 kg)  Height: 5' 0.8" (1.544 m)  PainSc: 0-No pain   Body mass index is 29.06 kg/m.   Objective:  Physical Exam Vitals signs reviewed.  Constitutional:      Appearance: She is well-developed.  Neck:     Musculoskeletal: Normal range of motion and neck supple.  Cardiovascular:     Rate and Rhythm: Normal rate and regular rhythm.     Heart sounds: Normal heart sounds. No murmur.  Pulmonary:     Effort: Pulmonary effort is normal.     Breath sounds: Normal breath sounds.  Chest:     Chest wall: No tenderness.  Musculoskeletal: Normal range of motion.  Skin:    General: Skin is warm and dry.     Capillary Refill: Capillary refill takes less than 2 seconds.  Neurological:     Mental Status: She is alert and oriented to person, place, and time.  Psychiatric:        Mood and Affect: Mood normal.       Assessment And Plan:     1. Encounter for hepatitis  C screening test for low risk patient  Will check for Hepatitis C screening due to being born between the years 1945-1965 - Hepatitis C antibody  2. Mixed hyperlipidemia  Chronic, controlled  Continue with current medications - Lipid panel  3. Prediabetes  Chronic, stable.  No current medications  Encouraged to limit intake of sugary foods and drinks  Encouraged to increase physical activity to 150 minutes per week - Hepatitis C antibody - Hemoglobin A1c  4. Health  maintenance examination . Behavior modifications discussed and diet history reviewed.   . Pt will continue to exercise regularly and modify diet with low GI, plant based foods and decrease intake of processed foods.  . Recommend intake of daily multivitamin, Vitamin D, and calcium.  . Recommend mammogram for preventive screenings, as well as recommend immunizations that include TDAP, and Shingles     [name], FNP

## 2018-10-28 NOTE — Patient Instructions (Addendum)
Pneumococcal Conjugate Vaccine suspension for injection What is this medicine? PNEUMOCOCCAL VACCINE (NEU mo KOK al vak SEEN) is a vaccine used to prevent pneumococcus bacterial infections. These bacteria can cause serious infections like pneumonia, meningitis, and blood infections. This vaccine will lower your chance of getting pneumonia. If you do get pneumonia, it can make your symptoms milder and your illness shorter. This vaccine will not treat an infection and will not cause infection. This vaccine is recommended for infants and young children, adults with certain medical conditions, and adults 28 years or older. This medicine may be used for other purposes; ask your health care provider or pharmacist if you have questions. COMMON BRAND NAME(S): Prevnar, Prevnar 13 What should I tell my health care provider before I take this medicine? They need to know if you have any of these conditions: -bleeding problems -fever -immune system problems -an unusual or allergic reaction to pneumococcal vaccine, diphtheria toxoid, other vaccines, latex, other medicines, foods, dyes, or preservatives -pregnant or trying to get pregnant -breast-feeding How should I use this medicine? This vaccine is for injection into a muscle. It is given by a health care professional. A copy of Vaccine Information Statements will be given before each vaccination. Read this sheet carefully each time. The sheet may change frequently. Talk to your pediatrician regarding the use of this medicine in children. While this drug may be prescribed for children as young as 58 weeks old for selected conditions, precautions do apply. Overdosage: If you think you have taken too much of this medicine contact a poison control center or emergency room at once. NOTE: This medicine is only for you. Do not share this medicine with others. What if I miss a dose? It is important not to miss your dose. Call your doctor or health care  professional if you are unable to keep an appointment. What may interact with this medicine? -medicines for cancer chemotherapy -medicines that suppress your immune function -steroid medicines like prednisone or cortisone This list may not describe all possible interactions. Give your health care provider a list of all the medicines, herbs, non-prescription drugs, or dietary supplements you use. Also tell them if you smoke, drink alcohol, or use illegal drugs. Some items may interact with your medicine. What should I watch for while using this medicine? Mild fever and pain should go away in 3 days or less. Report any unusual symptoms to your doctor or health care professional. What side effects may I notice from receiving this medicine? Side effects that you should report to your doctor or health care professional as soon as possible: -allergic reactions like skin rash, itching or hives, swelling of the face, lips, or tongue -breathing problems -confused -fast or irregular heartbeat -fever over 102 degrees F -seizures -unusual bleeding or bruising -unusual muscle weakness Side effects that usually do not require medical attention (report to your doctor or health care professional if they continue or are bothersome): -aches and pains -diarrhea -fever of 102 degrees F or less -headache -irritable -loss of appetite -pain, tender at site where injected -trouble sleeping This list may not describe all possible side effects. Call your doctor for medical advice about side effects. You may report side effects to FDA at 1-800-FDA-1088. Where should I keep my medicine? This does not apply. This vaccine is given in a clinic, pharmacy, doctor's office, or other health care setting and will not be stored at home. NOTE: This sheet is a summary. It may not cover all  possible information. If you have questions about this medicine, talk to your doctor, pharmacist, or health care provider.  2019  Elsevier/Gold Standard (2014-07-14 10:27:27)   Prediabetes Prediabetes is the condition of having a blood sugar (blood glucose) level that is higher than it should be, but not high enough for you to be diagnosed with type 2 diabetes. Having prediabetes puts you at risk for developing type 2 diabetes (type 2 diabetes mellitus). Prediabetes may be called impaired glucose tolerance or impaired fasting glucose. Prediabetes usually does not cause symptoms. Your health care provider can diagnose this condition with blood tests. You may be tested for prediabetes if you are overweight and if you have at least one other risk factor for prediabetes. What is blood glucose, and how is it measured? Blood glucose refers to the amount of glucose in your bloodstream. Glucose comes from eating foods that contain sugars and starches (carbohydrates), which the body breaks down into glucose. Your blood glucose level may be measured in mg/dL (milligrams per deciliter) or mmol/L (millimoles per liter). Your blood glucose may be checked with one or more of the following blood tests:  A fasting blood glucose (FBG) test. You will not be allowed to eat (you will fast) for 8 hours or longer before a blood sample is taken. ? A normal range for FBG is 70-100 mg/dl (3.9-5.6 mmol/L).  An A1c (hemoglobin A1c) blood test. This test provides information about blood glucose control over the previous 2?22months.  An oral glucose tolerance test (OGTT). This test measures your blood glucose at two times: ? After fasting. This is your baseline level. ? Two hours after you drink a beverage that contains glucose. You may be diagnosed with prediabetes:  If your FBG is 100?125 mg/dL (5.6-6.9 mmol/L).  If your A1c level is 5.7?6.4%.  If your OGTT result is 140?199 mg/dL (7.8-11 mmol/L). These blood tests may be repeated to confirm your diagnosis. How can this condition affect me? The pancreas produces a hormone (insulin) that helps  to move glucose from the bloodstream into cells. When cells in the body do not respond properly to insulin that the body makes (insulin resistance), excess glucose builds up in the blood instead of going into cells. As a result, high blood glucose (hyperglycemia) can develop, which can cause many complications. Hyperglycemia is a symptom of prediabetes. Having high blood glucose for a long time is dangerous. Too much glucose in your blood can damage your nerves and blood vessels. Long-term damage can lead to complications from diabetes, which may include:  Heart disease.  Stroke.  Blindness.  Kidney disease.  Depression.  Poor circulation in the feet and legs, which could lead to surgical removal (amputation) in severe cases. What can increase my risk? Risk factors for prediabetes include:  Having a family member with type 2 diabetes.  Being overweight or obese.  Being older than age 19.  Being of American [place], African-American, Hispanic/Latino, or Asian/Pacific Islander descent.  Having an inactive (sedentary) lifestyle.  Having a history of heart disease.  History of gestational diabetes or polycystic ovary syndrome (PCOS), in women.  Having low levels of good cholesterol (HDL-C) or high levels of blood fats (triglycerides).  Having high blood pressure. What actions can I take to prevent diabetes?      Be physically active. ? Do moderate-intensity physical activity for 30 or more minutes on 5 or more days of the week, or as much as told by your health care provider. This could be brisk  walking, biking, or water aerobics. ? Ask your health care provider what activities are safe for you. A mix of physical activities may be best, such as walking, swimming, cycling, and strength training.  Lose weight as told by your health care provider. ? Losing 5-7% of your body weight can reverse insulin resistance. ? Your health care provider can determine how much weight loss is  best for you and can help you lose weight safely.  Follow a healthy meal plan. This includes eating lean proteins, complex carbohydrates, fresh fruits and vegetables, low-fat dairy products, and healthy fats. ? Follow instructions from your health care provider about eating or drinking restrictions. ? Make an appointment to see a diet and nutrition specialist (registered dietitian) to help you create a healthy eating plan that is right for you.  Do not smoke or use any tobacco products, such as cigarettes, chewing tobacco, and e-cigarettes. If you need help quitting, ask your health care provider.  Take over-the-counter and prescription medicines as told by your health care provider. You may be prescribed medicines that help lower the risk of type 2 diabetes.  Keep all follow-up visits as told by your health care provider. This is important. Summary  Prediabetes is the condition of having a blood sugar (blood glucose) level that is higher than it should be, but not high enough for you to be diagnosed with type 2 diabetes.  Having prediabetes puts you at risk for developing type 2 diabetes (type 2 diabetes mellitus).  To help prevent type 2 diabetes, make lifestyle changes such as being physically active and eating a healthy diet. Lose weight as told by your health care provider. This information is not intended to replace advice given to you by your health care provider. Make sure you discuss any questions you have with your health care provider. Document Released: 01/29/2016 Document Revised: 05/27/2017 Document Reviewed: 11/28/2015 Elsevier Interactive Patient Education  2019 [place] TO GO TO THE PHARMACY FOR YOUR PREVNAR (PNEUMONIA SHOT)

## 2018-10-29 LAB — HEPATITIS C ANTIBODY: Hep C Virus Ab: <0.1 s/co ratio (ref 0.0–0.9)

## 2018-10-29 LAB — HEMOGLOBIN A1C
Est. average glucose Bld gHb Est-mCnc: 117 mg/dL
Hgb A1c MFr Bld: 5.7 % — ABNORMAL HIGH (ref 4.8–5.6)

## 2018-10-29 LAB — LIPID PANEL
Chol/HDL Ratio: 3.4 ratio (ref 0.0–4.4)
Cholesterol, Total: 192 mg/dL (ref 100–199)
HDL: 56 mg/dL (ref >39)
LDL Calculated: 122 mg/dL — ABNORMAL HIGH (ref 0–99)
Triglycerides: 69 mg/dL (ref 0–149)
VLDL Cholesterol Cal: 14 mg/dL (ref 5–40)

## 2019-04-28 ENCOUNTER — Other Ambulatory Visit: Payer: Self-pay

## 2019-04-28 ENCOUNTER — Encounter: Payer: Self-pay | Admitting: Nurse Practitioner

## 2019-04-28 ENCOUNTER — Ambulatory Visit: Payer: Medicare Other | Admitting: Nurse Practitioner

## 2019-04-28 VITALS — BP 134/76 | HR 58 | Temp 98.4°F | Ht 60.8 in | Wt 147.6 lb

## 2019-04-28 DIAGNOSIS — R7303 Prediabetes: Secondary | ICD-10-CM

## 2019-04-28 DIAGNOSIS — E782 Mixed hyperlipidemia: Secondary | ICD-10-CM | POA: Diagnosis not present

## 2019-04-28 NOTE — Progress Notes (Signed)
  Subjective:     Patient ID: Dawn Wang , female    DOB: May 07, 1946 , 73 y.o.   MRN: [id number]   Chief Complaint  Patient presents with  . pre-diabetes    HPI  Wt Readings from Last 3 Encounters: 04/28/19 : 147 lb 9.6 oz (67 kg) 10/28/18 : 152 lb 12.8 oz (69.3 kg) 09/15/18 : 151 lb (68.5 kg)   Diabetes She presents for her follow-up diabetic visit. Diabetes type: prediabetic. There are no hypoglycemic associated symptoms. Pertinent negatives for hypoglycemia include no dizziness or headaches. There are no hypoglycemic complications. There are no diabetic complications. Risk factors for coronary artery disease include sedentary lifestyle.     No past medical history on file.   Family History  Problem Relation Age of Onset  . Cancer Mother   . Diabetes Father   . Hypertension Father     No current outpatient medications on file.   No Known Allergies   Review of Systems  Constitutional: Negative.   Respiratory: Negative.   Cardiovascular: Negative.   Neurological: Negative for dizziness and headaches.  Psychiatric/Behavioral: Negative.      Today's Vitals   04/28/19 1010  BP: 134/76  Pulse: (!) 58  Temp: 98.4 F (36.9 C)  TempSrc: Oral  Weight: 147 lb 9.6 oz (67 kg)  Height: 5' 0.8" (1.544 m)  PainSc: 0-No pain   Body mass index is 28.07 kg/m.   Objective:  Physical Exam Vitals signs reviewed.  Constitutional:      Appearance: Normal appearance.  Cardiovascular:     Rate and Rhythm: Normal rate and regular rhythm.     Pulses: Normal pulses.     Heart sounds: No murmur.  Pulmonary:     Effort: Pulmonary effort is normal.     Breath sounds: Normal breath sounds.  Skin:    General: Skin is warm and dry.     Capillary Refill: Capillary refill takes less than 2 seconds.  Neurological:     General: No focal deficit present.     Mental Status: She is alert and oriented to person, place, and time.  Psychiatric:        Mood and Affect: Mood normal.         Behavior: Behavior normal.        Thought Content: Thought content normal.        Judgment: Judgment normal.         Assessment And Plan:     1. Mixed hyperlipidemia  Chronic, controlled  Continue with current medications - BMP8+eGFR - Hemoglobin A1c  2. Prediabetes  Chronic, stable  She has had a 5 lb weight loss since January, congratulated on this.  She is not taking any medications  Encouraged to limit intake of sugary foods and drinks  Encouraged to increase physical activity to 150 minutes per week as tolerated - BMP8+eGFR - Hemoglobin A1c   [name], FNP    THE PATIENT IS ENCOURAGED TO PRACTICE SOCIAL DISTANCING DUE TO THE COVID-19 PANDEMIC.

## 2019-04-29 LAB — BMP8+EGFR
BUN/Creatinine Ratio: 15 (ref 12–28)
BUN: 13 mg/dL (ref 8–27)
CO2: 24 mmol/L (ref 20–29)
Calcium: 9.3 mg/dL (ref 8.7–10.3)
Chloride: 104 mmol/L (ref 96–106)
Creatinine, Ser: 0.84 mg/dL (ref 0.57–1.00)
GFR calc Af Amer: 81 mL/min/{1.73_m2} (ref >59)
GFR calc non Af Amer: 70 mL/min/{1.73_m2} (ref >59)
Glucose: 88 mg/dL (ref 65–99)
Potassium: 4.1 mmol/L (ref 3.5–5.2)
Sodium: 140 mmol/L (ref 134–144)

## 2019-04-29 LAB — HEMOGLOBIN A1C
Est. average glucose Bld gHb Est-mCnc: 120 mg/dL
Hgb A1c MFr Bld: 5.8 % — ABNORMAL HIGH (ref 4.8–5.6)

## 2019-05-14 ENCOUNTER — Encounter: Payer: Self-pay | Admitting: Nurse Practitioner

## 2019-06-30 ENCOUNTER — Ambulatory Visit (INDEPENDENT_AMBULATORY_CARE_PROVIDER_SITE_OTHER): Payer: Medicare Other

## 2019-06-30 ENCOUNTER — Other Ambulatory Visit: Payer: Self-pay

## 2019-06-30 VITALS — BP 130/70 | HR 65 | Temp 98.2°F | Ht 61.0 in | Wt 148.6 lb

## 2019-06-30 DIAGNOSIS — Z0000 Encounter for general adult medical examination without abnormal findings: Secondary | ICD-10-CM | POA: Diagnosis not present

## 2019-06-30 NOTE — Progress Notes (Signed)
Subjective:   Dawn Wang is a 73 y.o. female who presents for Medicare Annual (Subsequent) preventive examination.  Review of Systems:  n/a Cardiac Risk Factors include: advanced age (>11men, >13 women);dyslipidemia     Objective:     Vitals: BP 130/70 (BP Location: Left Arm, Patient Position: Sitting, Cuff Size: Normal)   Pulse 65   Temp 98.2 F (36.8 C) (Oral)   Ht 5\' 1"  (1.549 m)   Wt 148 lb 9.6 oz (67.4 kg)   BMI 28.08 kg/m   Body mass index is 28.08 kg/m.  Advanced Directives 06/30/2019 09/15/2018  Does Patient Have a Medical Advance Directive? No No  Would patient like information on creating a medical advance directive? - Yes (MAU/Ambulatory/Procedural Areas - Information given)    Tobacco Social History   Tobacco Use  Smoking Status Never Smoker  Smokeless Tobacco Never Used     Counseling given: Not Answered   Clinical Intake:  Pre-visit preparation completed: Yes  Pain : No/denies pain     Nutritional Status: BMI 25 -29 Overweight Nutritional Risks: None Diabetes: No  How often do you need to have someone help you when you read instructions, pamphlets, or other written materials from your doctor or pharmacy?: 1 - Never What is the last grade level you completed in school?: master's degree  Interpreter Needed?: No  Information entered by :: NAllen LPN  History reviewed. No pertinent past medical history. History reviewed. No pertinent surgical history. Family History  Problem Relation Age of Onset  . Cancer Mother   . Diabetes Father   . Hypertension Father    Social History   Socioeconomic History  . Marital status: Married    Spouse name: Not on file  . Number of children: Not on file  . Years of education: Not on file  . Highest education level: Not on file  Occupational History  . Occupation: retired  Scientific laboratory technician  . Financial resource strain: Not hard at all  . Food insecurity    Worry: Never true    Inability: Never  true  . Transportation needs    Medical: No    Non-medical: No  Tobacco Use  . Smoking status: Never Smoker  . Smokeless tobacco: Never Used  Substance and Sexual Activity  . Alcohol use: Not Currently    Frequency: Never  . Drug use: Not Currently  . Sexual activity: Yes  Lifestyle  . Physical activity    Days per week: 0 days    Minutes per session: 0 min  . Stress: Not at all  Relationships  . Social connections    Talks on phone: More than three times a week    Gets together: Once a week    Attends religious service: More than 4 times per year    Active member of club or organization: No    Attends meetings of clubs or organizations: Never    Relationship status: Married  Other Topics Concern  . Not on file  Social History Narrative  . Not on file    No outpatient encounter medications on file as of 06/30/2019.   No facility-administered encounter medications on file as of 06/30/2019.     Activities of Daily Living In your present state of health, do you have any difficulty performing the following activities: 06/30/2019 09/15/2018  Hearing? Y N  Comment sometimes with wax build up -  Vision? N N  Difficulty concentrating or making decisions? N N  Walking or climbing stairs?  N N  Dressing or bathing? N N  Doing errands, shopping? N N  Preparing Food and eating ? N N  Using the Toilet? N N  In the past six months, have you accidently leaked urine? N N  Do you have problems with loss of bowel control? N N  Managing your Medications? N N  Managing your Finances? N N  Housekeeping or managing your Housekeeping? N N  Some recent data might be hidden    Patient Care Team: [name], FNP as PCP - General (General Practice)    Assessment:   This is a routine wellness examination for [place].  Exercise Activities and Dietary recommendations Current Exercise Habits: The patient does not participate in regular exercise at present  Goals    . Patient Stated      06/30/2019, to get colonoscopy       Fall Risk Fall Risk  06/30/2019 04/28/2019 10/28/2018 09/15/2018 08/28/2015  Falls in the past year? 0 0 0 0 No  Number falls in past yr: - - - 0 -  Injury with Fall? - - - 0 -  Follow up Falls evaluation completed;Education provided;Falls prevention discussed - - - -   Is the patient's home free of loose throw rugs in walkways, pet beds, electrical cords, etc?   yes      Grab bars in the bathroom? no      Handrails on the stairs?   yes      Adequate lighting?   yes  Timed Get Up and Go performed: n/a  Depression Screen PHQ 2/9 Scores 06/30/2019 04/28/2019 10/28/2018 09/15/2018  PHQ - 2 Score 0 0 0 0  PHQ- 9 Score 1 - - -     Cognitive Function     6CIT Screen 06/30/2019 09/15/2018  What Year? 0 points 0 points  What month? 0 points 0 points  What time? 0 points 0 points  Count back from 20 0 points 0 points  Months in reverse 0 points 0 points  Repeat phrase 0 points 0 points  Total Score 0 0    Immunization History  Administered Date(s) Administered  . Influenza-Unspecified 07/21/2018    Qualifies for Shingles Vaccine? yes  Screening Tests Health Maintenance  Topic Date Due  . COLONOSCOPY  12/26/1997  . PNA vac Low Risk Adult (2 of 2 - PPSV23) 04/08/2014  . INFLUENZA VACCINE  05/22/2019  . MAMMOGRAM  09/22/2020  . TETANUS/TDAP  10/21/2022  . DEXA SCAN  Completed  . Hepatitis C Screening  Completed    Cancer Screenings: Lung: Low Dose CT Chest recommended if Age 38-80 years, 30 pack-year currently smoking OR have quit w/in 15years. Patient does not qualify. Breast:  Up to date on Mammogram? Yes   Up to date of Bone Density/Dexa? Yes Colorectal: scheduled  Additional Screenings: : Hepatitis C Screening: 10/2018     Plan:    Patient scheduled to get colonoscopy 10/29/2019.   I have personally reviewed and noted the following in the patient's chart:   . Medical and social history . Use of alcohol, tobacco or illicit drugs  .  Current medications and supplements . Functional ability and status . Nutritional status . Physical activity . Advanced directives . List of other physicians . Hospitalizations, surgeries, and ER visits in previous 12 months . Vitals . Screenings to include cognitive, depression, and falls . Referrals and appointments  In addition, I have reviewed and discussed with patient certain preventive protocols, quality metrics, and best  practice recommendations. A written personalized care plan for preventive services as well as general preventive health recommendations were provided to patient.     Kellie Simmering, LPN  [phone]

## 2019-06-30 NOTE — Patient Instructions (Signed)
[name] , Thank you for taking time to come for your Medicare Wellness Visit. I appreciate your ongoing commitment to your health goals. Please review the following plan we discussed and let me know if I can assist you in the future.   Screening recommendations/referrals: Colonoscopy: scheduled Mammogram: 09/2018 Bone Density: 11/2017 Recommended yearly ophthalmology/optometry visit for glaucoma screening and checkup Recommended yearly dental visit for hygiene and checkup  Vaccinations: Influenza vaccine: 07/2018 Pneumococcal vaccine: 10/2018 Tdap vaccine: 10/2012 Shingles vaccine: discussed    Advanced directives: Advance directive discussed with you today. Even though you declined this today please call our office should you change your mind and we can give you the proper paperwork for you to fill out.   Conditions/risks identified: overweight  Next appointment: 11/03/2019 at 10:00   Preventive Care 71 Years and Older, Female Preventive care refers to lifestyle choices and visits with your health care provider that can promote health and wellness. What does preventive care include?  A yearly physical exam. This is also called an annual well check.  Dental exams once or twice a year.  Routine eye exams. Ask your health care provider how often you should have your eyes checked.  Personal lifestyle choices, including:  Daily care of your teeth and gums.  Regular physical activity.  Eating a healthy diet.  Avoiding tobacco and drug use.  Limiting alcohol use.  Practicing safe sex.  Taking low-dose aspirin every day.  Taking vitamin and mineral supplements as recommended by your health care provider. What happens during an annual well check? The services and screenings done by your health care provider during your annual well check will depend on your age, overall health, lifestyle risk factors, and family history of disease. Counseling  Your health care provider may  ask you questions about your:  Alcohol use.  Tobacco use.  Drug use.  Emotional well-being.  Home and relationship well-being.  Sexual activity.  Eating habits.  History of falls.  Memory and ability to understand (cognition).  Work and work Statistician.  Reproductive health. Screening  You may have the following tests or measurements:  Height, weight, and BMI.  Blood pressure.  Lipid and cholesterol levels. These may be checked every 5 years, or more frequently if you are over 15 years old.  Skin check.  Lung cancer screening. You may have this screening every year starting at age 71 if you have a 30-pack-year history of smoking and currently smoke or have quit within the past 15 years.  Fecal occult blood test (FOBT) of the stool. You may have this test every year starting at age 71.  Flexible sigmoidoscopy or colonoscopy. You may have a sigmoidoscopy every 5 years or a colonoscopy every 10 years starting at age 71.  Hepatitis C blood test.  Hepatitis B blood test.  Sexually transmitted disease (STD) testing.  Diabetes screening. This is done by checking your blood sugar (glucose) after you have not eaten for a while (fasting). You may have this done every 1-3 years.  Bone density scan. This is done to screen for osteoporosis. You may have this done starting at age 40.  Mammogram. This may be done every 1-2 years. Talk to your health care provider about how often you should have regular mammograms. Talk with your health care provider about your test results, treatment options, and if necessary, the need for more tests. Vaccines  Your health care provider may recommend certain vaccines, such as:  Influenza vaccine. This is recommended every  year.  Tetanus, diphtheria, and acellular pertussis (Tdap, Td) vaccine. You may need a Td booster every 10 years.  Zoster vaccine. You may need this after age 68.  Pneumococcal 13-valent conjugate (PCV13) vaccine. One  dose is recommended after age 5.  Pneumococcal polysaccharide (PPSV23) vaccine. One dose is recommended after age 48. Talk to your health care provider about which screenings and vaccines you need and how often you need them. This information is not intended to replace advice given to you by your health care provider. Make sure you discuss any questions you have with your health care provider. Document Released: 11/03/2015 Document Revised: 06/26/2016 Document Reviewed: 08/08/2015 Elsevier Interactive Patient Education  2017 [place] Prevention in the Home Falls can cause injuries. They can happen to people of all ages. There are many things you can do to make your home safe and to help prevent falls. What can I do on the outside of my home?  Regularly fix the edges of walkways and driveways and fix any cracks.  Remove anything that might make you trip as you walk through a door, such as a raised step or threshold.  Trim any bushes or trees on the path to your home.  Use bright outdoor lighting.  Clear any walking paths of anything that might make someone trip, such as rocks or tools.  Regularly check to see if handrails are loose or broken. Make sure that both sides of any steps have handrails.  Any raised decks and porches should have guardrails on the edges.  Have any leaves, snow, or ice cleared regularly.  Use sand or salt on walking paths during winter.  Clean up any spills in your garage right away. This includes oil or grease spills. What can I do in the bathroom?  Use night lights.  Install grab bars by the toilet and in the tub and shower. Do not use towel bars as grab bars.  Use non-skid mats or decals in the tub or shower.  If you need to sit down in the shower, use a plastic, non-slip stool.  Keep the floor dry. Clean up any water that spills on the floor as soon as it happens.  Remove soap buildup in the tub or shower regularly.  Attach bath  mats securely with double-sided non-slip rug tape.  Do not have throw rugs and other things on the floor that can make you trip. What can I do in the bedroom?  Use night lights.  Make sure that you have a light by your bed that is easy to reach.  Do not use any sheets or blankets that are too big for your bed. They should not hang down onto the floor.  Have a firm chair that has side arms. You can use this for support while you get dressed.  Do not have throw rugs and other things on the floor that can make you trip. What can I do in the kitchen?  Clean up any spills right away.  Avoid walking on wet floors.  Keep items that you use a lot in easy-to-reach places.  If you need to reach something above you, use a strong step stool that has a grab bar.  Keep electrical cords out of the way.  Do not use floor polish or wax that makes floors slippery. If you must use wax, use non-skid floor wax.  Do not have throw rugs and other things on the floor that can make you trip. What can  I do with my stairs?  Do not leave any items on the stairs.  Make sure that there are handrails on both sides of the stairs and use them. Fix handrails that are broken or loose. Make sure that handrails are as long as the stairways.  Check any carpeting to make sure that it is firmly attached to the stairs. Fix any carpet that is loose or worn.  Avoid having throw rugs at the top or bottom of the stairs. If you do have throw rugs, attach them to the floor with carpet tape.  Make sure that you have a light switch at the top of the stairs and the bottom of the stairs. If you do not have them, ask someone to add them for you. What else can I do to help prevent falls?  Wear shoes that:  Do not have high heels.  Have rubber bottoms.  Are comfortable and fit you well.  Are closed at the toe. Do not wear sandals.  If you use a stepladder:  Make sure that it is fully opened. Do not climb a closed  stepladder.  Make sure that both sides of the stepladder are locked into place.  Ask someone to hold it for you, if possible.  Clearly Shorts and make sure that you can see:  Any grab bars or handrails.  First and last steps.  Where the edge of each step is.  Use tools that help you move around (mobility aids) if they are needed. These include:  Canes.  Walkers.  Scooters.  Crutches.  Turn on the lights when you go into a dark area. Replace any light bulbs as soon as they burn out.  Set up your furniture so you have a clear path. Avoid moving your furniture around.  If any of your floors are uneven, fix them.  If there are any pets around you, be aware of where they are.  Review your medicines with your doctor. Some medicines can make you feel dizzy. This can increase your chance of falling. Ask your doctor what other things that you can do to help prevent falls. This information is not intended to replace advice given to you by your health care provider. Make sure you discuss any questions you have with your health care provider. Document Released: 08/03/2009 Document Revised: 03/14/2016 Document Reviewed: 11/11/2014 Elsevier Interactive Patient Education  2017 [employer].

## 2019-08-18 ENCOUNTER — Other Ambulatory Visit: Payer: Self-pay | Admitting: Internal Medicine

## 2019-08-18 DIAGNOSIS — Z1231 Encounter for screening mammogram for malignant neoplasm of breast: Secondary | ICD-10-CM

## 2019-09-12 ENCOUNTER — Other Ambulatory Visit: Payer: Self-pay | Admitting: Cardiology

## 2019-09-12 DIAGNOSIS — Z20822 Contact with and (suspected) exposure to covid-19: Secondary | ICD-10-CM

## 2019-09-13 LAB — NOVEL CORONAVIRUS, NAA: SARS-CoV-2, NAA: NOT DETECTED

## 2019-09-23 ENCOUNTER — Ambulatory Visit: Payer: Medicare Other

## 2019-09-23 ENCOUNTER — Encounter: Payer: Medicare Other | Admitting: Nurse Practitioner

## 2019-10-05 ENCOUNTER — Other Ambulatory Visit: Payer: Self-pay

## 2019-10-05 ENCOUNTER — Ambulatory Visit
Admission: RE | Admit: 2019-10-05 | Discharge: 2019-10-05 | Disposition: A | Discharge status: Home / Self Care | Payer: Medicare Other | Source: Ambulatory Visit | Attending: Internal Medicine | Admitting: Internal Medicine

## 2019-10-05 DIAGNOSIS — Z1231 Encounter for screening mammogram for malignant neoplasm of breast: Secondary | ICD-10-CM

## 2019-10-21 ENCOUNTER — Encounter: Payer: Self-pay | Admitting: Nurse Practitioner

## 2019-10-25 ENCOUNTER — Encounter: Payer: Self-pay | Admitting: Nurse Practitioner

## 2019-11-03 ENCOUNTER — Encounter: Payer: Medicare Other | Admitting: Nurse Practitioner

## 2019-11-03 ENCOUNTER — Ambulatory Visit: Payer: Medicare Other

## 2019-11-15 ENCOUNTER — Other Ambulatory Visit: Payer: Self-pay

## 2019-11-15 ENCOUNTER — Ambulatory Visit: Payer: Medicare PPO | Admitting: Nurse Practitioner

## 2019-11-15 ENCOUNTER — Encounter: Payer: Self-pay | Admitting: Nurse Practitioner

## 2019-11-15 VITALS — BP 112/80 | HR 71 | Temp 97.9°F | Ht 61.4 in | Wt 151.4 lb

## 2019-11-15 DIAGNOSIS — Z0000 Encounter for general adult medical examination without abnormal findings: Secondary | ICD-10-CM | POA: Diagnosis not present

## 2019-11-15 DIAGNOSIS — E782 Mixed hyperlipidemia: Secondary | ICD-10-CM

## 2019-11-15 DIAGNOSIS — R7303 Prediabetes: Secondary | ICD-10-CM | POA: Diagnosis not present

## 2019-11-15 LAB — POCT URINALYSIS DIPSTICK
Bilirubin, UA: NEGATIVE
Blood, UA: NEGATIVE
Glucose, UA: NEGATIVE
Ketones, UA: NEGATIVE
Nitrite, UA: NEGATIVE
Protein, UA: NEGATIVE
Spec Grav, UA: >=1.03 — AB (ref 1.010–1.025)
Urobilinogen, UA: 0.2 E.U./dL
pH, UA: 5 (ref 5.0–8.0)

## 2019-11-15 NOTE — Progress Notes (Signed)
This visit occurred during the SARS-CoV-2 public health emergency.  Safety protocols were in place, including screening questions prior to the visit, additional usage of staff PPE, and extensive cleaning of exam room while observing appropriate contact time as indicated for disinfecting solutions.  Subjective:     Patient ID: Dawn Wang , female    DOB: 10/19/1946 , 73 y.o.   MRN: [id number]   Chief Complaint  Patient presents with  . Annual Exam    HPI  Here for HM.    She had her first covid vaccine last week 11/11/2019.  No significant side effects.   She is scheduled to have her colonoscopy on Wednesday.    Wt Readings from Last 3 Encounters: 11/15/19 : 151 lb 6.4 oz (68.7 kg) 06/30/19 : 148 lb 9.6 oz (67.4 kg) 04/28/19 : 147 lb 9.6 oz (67 kg)   Diabetes She presents for her follow-up diabetic visit. Diabetes type: prediabetes. There are no hypoglycemic associated symptoms. Pertinent negatives for hypoglycemia include no dizziness or headaches. There are no diabetic associated symptoms.    The patient states she uses status post hysterectomy for birth control. Mammogram last done 10/06/2019 Negative for: breast discharge, breast lump(s), breast pain and breast self exam.  Pertinent negatives include abnormal bleeding (hematology), anxiety, decreased libido, depression, difficulty falling sleep, dyspareunia, history of infertility, nocturia, sexual dysfunction, sleep disturbances, urinary incontinence, urinary urgency, vaginal discharge and vaginal itching. Diet regular.  The patient states her exercise level is moderate with gardening and walking outside    The patient's tobacco use is:  Social History   Tobacco Use  Smoking Status Never Smoker  Smokeless Tobacco Never Used   She has been exposed to passive smoke. The patient's alcohol use is:  Social History   Substance and Sexual Activity  Alcohol Use Not Currently   No past medical history on file.   Family  History  Problem Relation Age of Onset  . Cancer Mother   . Diabetes Father   . Hypertension Father     No current outpatient medications on file.   No Known Allergies   Review of Systems  Constitutional: Negative.   HENT: Negative.   Eyes: Negative.   Respiratory: Negative.   Cardiovascular: Negative.   Gastrointestinal: Negative.   Endocrine: Negative.   Genitourinary: Negative.   Musculoskeletal: Negative.   Skin: Negative.   Allergic/Immunologic: Negative.   Neurological: Negative.  Negative for dizziness and headaches.  Hematological: Negative.   Psychiatric/Behavioral: Negative.      Today's Vitals   11/15/19 1132  BP: 112/80  Pulse: 71  Temp: 97.9 F (36.6 C)  TempSrc: Oral  Weight: 151 lb 6.4 oz (68.7 kg)  Height: 5' 1.4" (1.56 m)  PainSc: 0-No pain   Body mass index is 28.24 kg/m.   Objective:  Physical Exam Constitutional:      General: She is not in acute distress.    Appearance: Normal appearance. She is well-developed.  HENT:     Head: Normocephalic and atraumatic.     Right Ear: Hearing, tympanic membrane, ear canal and external ear normal.     Left Ear: Hearing, tympanic membrane, ear canal and external ear normal.     Nose: Nose normal.     Mouth/Throat:     Mouth: Mucous membranes are moist.  Eyes:     General: Lids are normal.     Conjunctiva/sclera: Conjunctivae normal.     Pupils: Pupils are equal, round, and reactive to light.       Funduscopic exam:    Right eye: No papilledema.        Left eye: No papilledema.  Neck:     Thyroid: No thyroid mass.     Vascular: No carotid bruit.  Cardiovascular:     Rate and Rhythm: Normal rate and regular rhythm.     Pulses: Normal pulses.     Heart sounds: Normal heart sounds. No murmur.  Pulmonary:     Effort: Pulmonary effort is normal.     Breath sounds: Normal breath sounds.  Chest:     Breasts:        Right: Normal. No mass or tenderness.        Left: Normal. No mass or tenderness.   Abdominal:     General: Abdomen is flat. Bowel sounds are normal.     Palpations: Abdomen is soft.  Musculoskeletal:        General: No swelling. Normal range of motion.     Cervical back: Full passive range of motion without pain, normal range of motion and neck supple.     Right lower leg: No edema.     Left lower leg: No edema.  Skin:    General: Skin is warm and dry.     Capillary Refill: Capillary refill takes less than 2 seconds.  Neurological:     General: No focal deficit present.     Mental Status: She is alert and oriented to person, place, and time.     Cranial Nerves: No cranial nerve deficit.     Sensory: No sensory deficit.  Psychiatric:        Mood and Affect: Mood normal.        Behavior: Behavior normal.        Thought Content: Thought content normal.        Judgment: Judgment normal.         Assessment And Plan:     1. Encounter for general adult medical examination w/o abnormal findings . Behavior modifications discussed and diet history reviewed.   . Pt will continue to exercise regularly and modify diet with low GI, plant based foods and decrease intake of processed foods.  . Recommend intake of daily multivitamin, Vitamin D, and calcium.  . Recommend mammogram (up to date) and colonoscopy for preventive screenings, as well as recommend immunizations that include influenza, TDAP - POCT Urinalysis Dipstick (81002)  2. Mixed hyperlipidemia  Chronic, controlled  No current medications - CMP14+EGFR - Lipid panel  3. Prediabetes  Chronic, stable  No current medications.  - CMP14+EGFR - Hemoglobin A1c  She has had her 1st covid immunization on 11/11/2019 [name], FNP    THE PATIENT IS ENCOURAGED TO PRACTICE SOCIAL DISTANCING DUE TO THE COVID-19 PANDEMIC.

## 2019-11-15 NOTE — Patient Instructions (Signed)
Health Maintenance  Topic Date Due  . COLONOSCOPY  12/26/1997  . PNA vac Low Risk Adult (2 of 2 - PPSV23) 04/08/2014  . MAMMOGRAM  10/04/2021  . TETANUS/TDAP  10/21/2022  . INFLUENZA VACCINE  Completed  . DEXA SCAN  Completed  . Hepatitis C Screening  Completed   Health Maintenance After Age 72 After age 51, you are at a higher risk for certain long-term diseases and infections as well as injuries from falls. Falls are a major cause of broken bones and head injuries in people who are older than age 51. Getting regular preventive care can help to keep you healthy and well. Preventive care includes getting regular testing and making lifestyle changes as recommended by your health care provider. Talk with your health care provider about:  Which screenings and tests you should have. A screening is a test that checks for a disease when you have no symptoms.  A diet and exercise plan that is right for you. What should I know about screenings and tests to prevent falls? Screening and testing are the best ways to find a health problem early. Early diagnosis and treatment give you the best chance of managing medical conditions that are common after age 32. Certain conditions and lifestyle choices may make you more likely to have a fall. Your health care provider may recommend:  Regular vision checks. Poor vision and conditions such as cataracts can make you more likely to have a fall. If you wear glasses, make sure to get your prescription updated if your vision changes.  Medicine review. Work with your health care provider to regularly review all of the medicines you are taking, including over-the-counter medicines. Ask your health care provider about any side effects that may make you more likely to have a fall. Tell your health care provider if any medicines that you take make you feel dizzy or sleepy.  Osteoporosis screening. Osteoporosis is a condition that causes the bones to get weaker. This can  make the bones weak and cause them to break more easily.  Blood pressure screening. Blood pressure changes and medicines to control blood pressure can make you feel dizzy.  Strength and balance checks. Your health care provider may recommend certain tests to check your strength and balance while standing, walking, or changing positions.  Foot health exam. Foot pain and numbness, as well as not wearing proper footwear, can make you more likely to have a fall.  Depression screening. You may be more likely to have a fall if you have a fear of falling, feel emotionally low, or feel unable to do activities that you used to do.  Alcohol use screening. Using too much alcohol can affect your balance and may make you more likely to have a fall. What actions can I take to lower my risk of falls? General instructions  Talk with your health care provider about your risks for falling. Tell your health care provider if: ? You fall. Be sure to tell your health care provider about all falls, even ones that seem minor. ? You feel dizzy, sleepy, or off-balance.  Take over-the-counter and prescription medicines only as told by your health care provider. These include any supplements.  Eat a healthy diet and maintain a healthy weight. A healthy diet includes low-fat dairy products, low-fat (lean) meats, and fiber from whole grains, beans, and lots of fruits and vegetables. Home safety  Remove any tripping hazards, such as rugs, cords, and clutter.  Install safety equipment  such as grab bars in bathrooms and safety rails on stairs.  Keep rooms and walkways well-lit. Activity   Follow a regular exercise program to stay fit. This will help you maintain your balance. Ask your health care provider what types of exercise are appropriate for you.  If you need a cane or walker, use it as recommended by your health care provider.  Wear supportive shoes that have nonskid soles. Lifestyle  Do not drink  alcohol if your health care provider tells you not to drink.  If you drink alcohol, limit how much you have: ? 0-1 drink a day for women. ? 0-2 drinks a day for men.  Be aware of how much alcohol is in your drink. In the [place] one drink equals one typical bottle of beer (12 oz), one-half glass of wine (5 oz), or one shot of hard liquor (1 oz).  Do not use any products that contain nicotine or tobacco, such as cigarettes and e-cigarettes. If you need help quitting, ask your health care provider. Summary  Having a healthy lifestyle and getting preventive care can help to protect your health and wellness after age 36.  Screening and testing are the best way to find a health problem early and help you avoid having a fall. Early diagnosis and treatment give you the best chance for managing medical conditions that are more common for people who are older than age 15.  Falls are a major cause of broken bones and head injuries in people who are older than age 24. Take precautions to prevent a fall at home.  Work with your health care provider to learn what changes you can make to improve your health and wellness and to prevent falls. This information is not intended to replace advice given to you by your health care provider. Make sure you discuss any questions you have with your health care provider. Document Revised: 01/28/2019 Document Reviewed: 08/20/2017 Elsevier Patient Education  2020 [employer].

## 2019-11-16 LAB — HEMOGLOBIN A1C
Est. average glucose Bld gHb Est-mCnc: 120 mg/dL
Hgb A1c MFr Bld: 5.8 % — ABNORMAL HIGH (ref 4.8–5.6)

## 2019-11-16 LAB — CMP14+EGFR
ALT: 15 IU/L (ref 0–32)
AST: 18 IU/L (ref 0–40)
Albumin/Globulin Ratio: 1.5 (ref 1.2–2.2)
Albumin: 4.1 g/dL (ref 3.7–4.7)
Alkaline Phosphatase: 83 IU/L (ref 39–117)
BUN/Creatinine Ratio: 14 (ref 12–28)
BUN: 13 mg/dL (ref 8–27)
Bilirubin Total: 0.3 mg/dL (ref 0.0–1.2)
CO2: 25 mmol/L (ref 20–29)
Calcium: 9.3 mg/dL (ref 8.7–10.3)
Chloride: 105 mmol/L (ref 96–106)
Creatinine, Ser: 0.92 mg/dL (ref 0.57–1.00)
GFR calc Af Amer: 72 mL/min/{1.73_m2} (ref >59)
GFR calc non Af Amer: 63 mL/min/{1.73_m2} (ref >59)
Globulin, Total: 2.8 g/dL (ref 1.5–4.5)
Glucose: 88 mg/dL (ref 65–99)
Potassium: 4.2 mmol/L (ref 3.5–5.2)
Sodium: 141 mmol/L (ref 134–144)
Total Protein: 6.9 g/dL (ref 6.0–8.5)

## 2019-11-16 LAB — LIPID PANEL
Chol/HDL Ratio: 3.4 ratio (ref 0.0–4.4)
Cholesterol, Total: 175 mg/dL (ref 100–199)
HDL: 51 mg/dL (ref >39)
LDL Chol Calc (NIH): 110 mg/dL — ABNORMAL HIGH (ref 0–99)
Triglycerides: 74 mg/dL (ref 0–149)
VLDL Cholesterol Cal: 14 mg/dL (ref 5–40)

## 2019-11-17 DIAGNOSIS — Z1211 Encounter for screening for malignant neoplasm of colon: Secondary | ICD-10-CM | POA: Diagnosis not present

## 2019-11-17 DIAGNOSIS — Z800 Family history of malignant neoplasm of digestive organs: Secondary | ICD-10-CM | POA: Diagnosis not present

## 2019-11-17 LAB — HM COLONOSCOPY

## 2019-11-18 ENCOUNTER — Encounter: Payer: Self-pay | Admitting: Nurse Practitioner

## 2019-11-28 ENCOUNTER — Ambulatory Visit: Payer: Medicare PPO

## 2019-11-28 ENCOUNTER — Ambulatory Visit: Payer: Medicare Other

## 2019-12-13 ENCOUNTER — Ambulatory Visit: Payer: Medicare Other

## 2020-02-23 ENCOUNTER — Encounter: Payer: Self-pay | Admitting: Nurse Practitioner

## 2020-02-24 ENCOUNTER — Other Ambulatory Visit: Payer: Self-pay

## 2020-02-24 ENCOUNTER — Ambulatory Visit: Payer: Medicare PPO | Admitting: Internal Medicine

## 2020-02-24 ENCOUNTER — Ambulatory Visit
Admission: RE | Admit: 2020-02-24 | Discharge: 2020-02-24 | Disposition: A | Discharge status: Home / Self Care | Payer: Medicare PPO | Source: Ambulatory Visit | Attending: Internal Medicine | Admitting: Internal Medicine

## 2020-02-24 VITALS — BP 134/84 | HR 68 | Temp 97.6°F | Ht 61.2 in | Wt 155.2 lb

## 2020-02-24 DIAGNOSIS — S66211A Strain of extensor muscle, fascia and tendon of right thumb at wrist and hand level, initial encounter: Secondary | ICD-10-CM | POA: Diagnosis not present

## 2020-02-24 DIAGNOSIS — G5601 Carpal tunnel syndrome, right upper limb: Secondary | ICD-10-CM

## 2020-02-24 DIAGNOSIS — S6991XA Unspecified injury of right wrist, hand and finger(s), initial encounter: Secondary | ICD-10-CM | POA: Diagnosis not present

## 2020-02-24 DIAGNOSIS — M79644 Pain in right finger(s): Secondary | ICD-10-CM | POA: Diagnosis not present

## 2020-02-24 NOTE — Progress Notes (Signed)
This visit occurred during the SARS-CoV-2 public health emergency.  Safety protocols were in place, including screening questions prior to the visit, additional usage of staff PPE, and extensive cleaning of exam room while observing appropriate contact time as indicated for disinfecting solutions.  Subjective:     Patient ID: Dawn Wang , female    DOB: 1946-01-16 , 74 y.o.   MRN: [id number]   Chief Complaint  Patient presents with  . Sprained finger    Thumb    HPI Injured R thumb when she was carring a box 2 weeks ago and hyperextended her R thumb. She did not seek care, just bandaged her thumb to immobilize the joint and has been getting better, but is still sore on the mid joint and swollen. Husband noticed a flat area on her R thenar region yesterday and wants to be checked. She is L hand dominant.  No past medical history on file.   Family History  Problem Relation Age of Onset  . Cancer Mother   . Diabetes Father   . Hypertension Father     No current outpatient medications on file.   No Known Allergies   Review of Systems  R thumb pain, swelling, flat thenar region below thumb. Denies numbness weakness or tingling.  Today's Vitals   02/24/20 0924  BP: 134/84  Pulse: 68  Temp: 97.6 F (36.4 C)  TempSrc: Oral  Weight: 155 lb 3.2 oz (70.4 kg)  Height: 5' 1.2" (1.554 m)  PainSc: 0-No pain   Body mass index is 29.13 kg/m.   Objective:  Physical Exam Vitals and nursing note reviewed.  Constitutional:      General: She is not in acute distress. HENT:     Head: Atraumatic.     Right Ear: External ear normal.     Left Ear: External ear normal.  Eyes:     General: No scleral icterus.    Conjunctiva/sclera: Conjunctivae normal.  Pulmonary:     Effort: Pulmonary effort is normal.  Musculoskeletal:        General: Swelling, tenderness and deformity present.     Cervical back: Neck supple.     Comments: R THUMB-  Has mild swelling of mid joint region which  is tender to palpation. Has nl ROM and strength. R HAND- has thenar atrophy on the area below her thumb distally.   Skin:    General: Skin is warm and dry.     Findings: No erythema or rash.  Neurological:     Mental Status: She is alert and oriented to person, place, and time.     Gait: Gait normal.  Psychiatric:        Mood and Affect: Mood normal.        Behavior: Behavior normal.        Thought Content: Thought content normal.        Judgment: Judgment normal.         Assessment And Plan:     1. Strain of extensor muscle, fascia and tendon of right thumb at wrist and hand level, initial encounter- acute. Advised to use topical NSAID like Voltaren on area as needed. I will call her as soon as I see the xray report. I still want her to see hand specialist for thenar atrophy. I taught her to do some thumb exercises in the mean time.  - DG Finger Thumb Right; Future - Ambulatory referral to Orthopedic Surgery  2. Thenar atrophy, right- new. As noted above -  Ambulatory referral to Orthopedic Surgery       Regana Kemple Beaumont, PA-C    THE PATIENT IS ENCOURAGED TO PRACTICE SOCIAL DISTANCING DUE TO THE COVID-19 PANDEMIC.

## 2020-02-28 ENCOUNTER — Encounter: Payer: Self-pay | Admitting: Physician Assistant

## 2020-02-28 ENCOUNTER — Other Ambulatory Visit: Payer: Self-pay

## 2020-02-28 ENCOUNTER — Ambulatory Visit: Payer: Medicare PPO | Admitting: Physician Assistant

## 2020-02-28 DIAGNOSIS — M65311 Trigger thumb, right thumb: Secondary | ICD-10-CM

## 2020-02-28 DIAGNOSIS — M65331 Trigger finger, right middle finger: Secondary | ICD-10-CM

## 2020-02-28 DIAGNOSIS — R200 Anesthesia of skin: Secondary | ICD-10-CM

## 2020-02-28 NOTE — Addendum Note (Signed)
Addended by: Michae Kava B on: 02/28/2020 03:58 PM   Modules accepted: Orders

## 2020-02-28 NOTE — Progress Notes (Signed)
Office Visit Note   Patient: Dawn Wang           Date of Birth: 02-Jun-1946           MRN: [id number] Visit Date: 02/28/2020              Requested by: Shelby Mattocks, PA-C [address] [place],  [place] [address] PCP: [name], FNP   Assessment & Plan: Visit Diagnoses:  1. Trigger finger, right middle finger   2. Trigger thumb, right thumb   3. Bilateral hand numbness     Plan: We will obtain EMG nerve conduction studies upper extremities to rule out carpal tunnel syndrome bilaterally.  Have her follow-up after the studies to go over results and discuss further treatment.  In regards to the trigger thumb and long finger trigger finger on the right hand would not recommend injections more than every 3 to 4 months.  Questions were encouraged and answered.  Follow-Up Instructions: Return After EMG nerve conduction studies.   Orders:  No orders of the defined types were placed in this encounter.  No orders of the defined types were placed in this encounter.     Procedures: No procedures performed   Clinical Data: No additional findings.   Subjective: Chief Complaint  Patient presents with  . Right Thumb - Pain    HPI Mrs. Dawn Wang is 74 year old female were seen for the first time for right thumb injury some 3 weeks ago she was carrying a box that was too heavy and since that time has had clicking locking in her right thumb.  She also notes swelling.  She has had a long finger on the right hand has been triggering for years.  She also has numbness tingling both hands.  She states she has had numbness tingling in both hands for years.  She does note that both of her hands wake her she has to shake them to get them to regain sensation.  She has noticed some atrophy in her right hand she is unsure how long has been there but did first noticed it after the thumb injury some 3 weeks ago.  Patient reports she is borderline diabetic.  Covid injection  was greater than 2 weeks ago. Review of Systems See HPI otherwise negative or noncontributory.  Objective: Vital Signs: There were no vitals taken for this visit.  Physical Exam Constitutional:      Appearance: She is not ill-appearing or diaphoretic.  Pulmonary:     Effort: Pulmonary effort is normal.  Neurological:     Mental Status: She is alert and oriented to person, place, and time.  Psychiatric:        Mood and Affect: Mood normal.     Ortho Exam Right thumb active triggering right long finger positive triggering.  She has palpable nodules are tender at the A1 pulleys of both the right thumb and right long finger.  Radial pulses are 2+ bilaterally.  Right hand thenar atrophy compared to left.  Positive Phalen's on the right.  Positive compression test on the right.  Tinel's is negative median nerve both wrists. Specialty Comments:  No specialty comments available.  Imaging: No results found.   PMFS History: Patient Active Problem List   Diagnosis Date Noted  . Trigger thumb, right thumb 02/28/2020  . Trigger finger, right middle finger 02/28/2020  . Prediabetes 10/28/2018  . Mixed hyperlipidemia 10/28/2018   History reviewed. No pertinent past medical history.  Family History  Problem Relation Age of Onset  . Cancer Mother   . Diabetes Father   . Hypertension Father     History reviewed. No pertinent surgical history. Social History   Occupational History  . Occupation: retired  Tobacco Use  . Smoking status: Never Smoker  . Smokeless tobacco: Never Used  Substance and Sexual Activity  . Alcohol use: Not Currently  . Drug use: Not Currently  . Sexual activity: Yes

## 2020-03-15 ENCOUNTER — Ambulatory Visit: Payer: Medicare PPO | Admitting: Physician Assistant

## 2020-03-27 ENCOUNTER — Telehealth: Payer: Self-pay | Admitting: Physical Medicine and Rehabilitation

## 2020-03-27 NOTE — Telephone Encounter (Signed)
Appointment cancelled per patient request

## 2020-03-27 NOTE — Telephone Encounter (Signed)
Patient called wanting to cancel her appointment on June 25.  Thank you.

## 2020-04-14 ENCOUNTER — Encounter: Payer: Medicare PPO | Admitting: Physical Medicine and Rehabilitation

## 2020-04-26 ENCOUNTER — Ambulatory Visit: Payer: Medicare PPO | Admitting: Physician Assistant

## 2020-05-15 ENCOUNTER — Ambulatory Visit: Payer: Medicare PPO | Admitting: Nurse Practitioner

## 2020-05-15 ENCOUNTER — Encounter: Payer: Self-pay | Admitting: Nurse Practitioner

## 2020-05-15 ENCOUNTER — Other Ambulatory Visit: Payer: Self-pay

## 2020-05-15 VITALS — BP 132/80 | HR 61 | Temp 98.0°F | Ht 62.2 in | Wt 155.4 lb

## 2020-05-15 DIAGNOSIS — E7800 Pure hypercholesterolemia, unspecified: Secondary | ICD-10-CM | POA: Diagnosis not present

## 2020-05-15 DIAGNOSIS — R7309 Other abnormal glucose: Secondary | ICD-10-CM

## 2020-05-15 NOTE — Progress Notes (Signed)
This visit occurred during the SARS-CoV-2 public health emergency.  Safety protocols were in place, including screening questions prior to the visit, additional usage of staff PPE, and extensive cleaning of exam room while observing appropriate contact time as indicated for disinfecting solutions.  Subjective:     Patient ID: Dawn Wang , female    DOB: September 09, 1946 , 74 y.o.   MRN: [id number]   Chief Complaint  Patient presents with  . Prediabetes  . Hyperlipidemia    HPI  Hyperlipidemia This is a chronic problem. The current episode started more than 1 year ago. The problem is controlled. She has no history of chronic renal disease. There are no known factors aggravating her hyperlipidemia. Pertinent negatives include no chest pain. There are no compliance problems.   Diabetes She presents for her follow-up diabetic visit. Diabetes type: prediabetes. Her disease course has been stable. There are no hypoglycemic associated symptoms. Pertinent negatives for hypoglycemia include no dizziness or headaches. There are no diabetic associated symptoms. Pertinent negatives for diabetes include no chest pain. There are no hypoglycemic complications. There are no diabetic complications. Risk factors for coronary artery disease include sedentary lifestyle. Compliance with diabetes treatment: no medications. When asked about meal planning, she reported none. She has not had a previous visit with a dietitian. She rarely participates in exercise. She does not see a podiatrist.Eye exam is not current.     History reviewed. No pertinent past medical history.   Family History  Problem Relation Age of Onset  . Cancer Mother   . Diabetes Father   . Hypertension Father     No current outpatient medications on file.   No Known Allergies   Review of Systems  Constitutional: Negative.   Respiratory: Negative.   Cardiovascular: Negative.  Negative for chest pain, palpitations and leg swelling.   Musculoskeletal:       Left lower extremity pain   Neurological: Negative for dizziness and headaches.  Psychiatric/Behavioral: Negative.      Today's Vitals   05/15/20 1112  BP: (!) 132/80  Pulse: 61  Temp: 98 F (36.7 C)  TempSrc: Oral  Weight: 155 lb 6.4 oz (70.5 kg)  Height: 5' 2.2" (1.58 m)  PainSc: 0-No pain   Body mass index is 28.24 kg/m.   Objective:  Physical Exam Constitutional:      General: She is not in acute distress.    Appearance: Normal appearance.  Cardiovascular:     Rate and Rhythm: Normal rate and regular rhythm.     Pulses: Normal pulses.     Heart sounds: Normal heart sounds. No murmur heard.   Pulmonary:     Effort: Pulmonary effort is normal. No respiratory distress.     Breath sounds: Normal breath sounds.  Skin:    Capillary Refill: Capillary refill takes less than 2 seconds.  Neurological:     General: No focal deficit present.     Mental Status: She is alert and oriented to person, place, and time.     Cranial Nerves: No cranial nerve deficit.  Psychiatric:        Mood and Affect: Mood normal.        Behavior: Behavior normal.        Thought Content: Thought content normal.        Judgment: Judgment normal.         Assessment And Plan:     1. Elevated LDL cholesterol level  No current medications  Will check lipid panel  2. Abnormal glucose   chronic, stable  Will check HgbA1c  Encouraged to continue with healthy diet and regular exercise   Patient was given opportunity to ask questions. Patient verbalized understanding of the plan and was able to repeat key elements of the plan. All questions were answered to their satisfaction.  [name], FNP   I, [name], FNP, have reviewed all documentation for this visit. The documentation on 05/15/20 for the exam, diagnosis, procedures, and orders are all accurate and complete.  THE PATIENT IS ENCOURAGED TO PRACTICE SOCIAL DISTANCING DUE TO THE COVID-19 PANDEMIC.

## 2020-05-16 ENCOUNTER — Encounter: Payer: Self-pay | Admitting: Nurse Practitioner

## 2020-05-16 LAB — LIPID PANEL
Chol/HDL Ratio: 3.7 ratio (ref 0.0–4.4)
Cholesterol, Total: 191 mg/dL (ref 100–199)
HDL: 51 mg/dL (ref >39)
LDL Chol Calc (NIH): 124 mg/dL — ABNORMAL HIGH (ref 0–99)
Triglycerides: 88 mg/dL (ref 0–149)
VLDL Cholesterol Cal: 16 mg/dL (ref 5–40)

## 2020-05-16 LAB — HEMOGLOBIN A1C
Est. average glucose Bld gHb Est-mCnc: 123 mg/dL
Hgb A1c MFr Bld: 5.9 % — ABNORMAL HIGH (ref 4.8–5.6)

## 2020-09-01 ENCOUNTER — Other Ambulatory Visit: Payer: Self-pay | Admitting: Internal Medicine

## 2020-09-01 DIAGNOSIS — Z1231 Encounter for screening mammogram for malignant neoplasm of breast: Secondary | ICD-10-CM

## 2020-10-16 ENCOUNTER — Ambulatory Visit
Admission: RE | Admit: 2020-10-16 | Discharge: 2020-10-16 | Disposition: A | Discharge status: Home / Self Care | Payer: Medicare PPO | Source: Ambulatory Visit | Attending: Internal Medicine | Admitting: Internal Medicine

## 2020-10-16 ENCOUNTER — Other Ambulatory Visit: Payer: Self-pay

## 2020-10-16 DIAGNOSIS — Z1231 Encounter for screening mammogram for malignant neoplasm of breast: Secondary | ICD-10-CM

## 2020-11-22 ENCOUNTER — Other Ambulatory Visit: Payer: Self-pay

## 2020-11-22 ENCOUNTER — Encounter: Payer: Self-pay | Admitting: Nurse Practitioner

## 2020-11-22 ENCOUNTER — Ambulatory Visit (INDEPENDENT_AMBULATORY_CARE_PROVIDER_SITE_OTHER): Payer: Medicare PPO

## 2020-11-22 ENCOUNTER — Ambulatory Visit: Payer: Medicare PPO | Admitting: Nurse Practitioner

## 2020-11-22 VITALS — BP 128/72 | HR 65 | Temp 97.7°F | Ht 62.2 in | Wt 147.0 lb

## 2020-11-22 DIAGNOSIS — E7800 Pure hypercholesterolemia, unspecified: Secondary | ICD-10-CM | POA: Diagnosis not present

## 2020-11-22 DIAGNOSIS — R7309 Other abnormal glucose: Secondary | ICD-10-CM

## 2020-11-22 DIAGNOSIS — Z0000 Encounter for general adult medical examination without abnormal findings: Secondary | ICD-10-CM | POA: Diagnosis not present

## 2020-11-22 NOTE — Patient Instructions (Signed)
Dawn Wang , Thank you for taking time to come for your Medicare Wellness Visit. I appreciate your ongoing commitment to your health goals. Please review the following plan we discussed and let me know if I can assist you in the future.   Screening recommendations/referrals: Colonoscopy: completed 11/17/2019, due 11/16/2024 Mammogram: completed 10/16/2020 Bone Density: completed 11/25/2017 Recommended yearly ophthalmology/optometry visit for glaucoma screening and checkup Recommended yearly dental visit for hygiene and checkup  Vaccinations: Influenza vaccine: completed 07/16/2020, due 05/21/2021 Pneumococcal vaccine: completed 10/29/2018 Tdap vaccine: completed 10/21/2012, due 10/21/2022 Shingles vaccine: completed   Covid-19: 07/16/2020,12/03/2019, 11/11/2019  Advanced directives: Advance directive discussed with you today. I have provided a copy for you to complete at home and have notarized. Once this is complete please bring a copy in to our office so we can scan it into your chart.  Conditions/risks identified: none  Next appointment: Follow up in one year for your annual wellness visit    Preventive Care 65 Years and Older, Female Preventive care refers to lifestyle choices and visits with your health care provider that can promote health and wellness. What does preventive care include?  A yearly physical exam. This is also called an annual well check.  Dental exams once or twice a year.  Routine eye exams. Ask your health care provider how often you should have your eyes checked.  Personal lifestyle choices, including:  Daily care of your teeth and gums.  Regular physical activity.  Eating a healthy diet.  Avoiding tobacco and drug use.  Limiting alcohol use.  Practicing safe sex.  Taking low-dose aspirin every day.  Taking vitamin and mineral supplements as recommended by your health care provider. What happens during an annual well check? The services and screenings done  by your health care provider during your annual well check will depend on your age, overall health, lifestyle risk factors, and family history of disease. Counseling  Your health care provider may ask you questions about your:  Alcohol use.  Tobacco use.  Drug use.  Emotional well-being.  Home and relationship well-being.  Sexual activity.  Eating habits.  History of falls.  Memory and ability to understand (cognition).  Work and work Statistician.  Reproductive health. Screening  You may have the following tests or measurements:  Height, weight, and BMI.  Blood pressure.  Lipid and cholesterol levels. These may be checked every 5 years, or more frequently if you are over 72 years old.  Skin check.  Lung cancer screening. You may have this screening every year starting at age 54 if you have a 30-pack-year history of smoking and currently smoke or have quit within the past 15 years.  Fecal occult blood test (FOBT) of the stool. You may have this test every year starting at age 62.  Flexible sigmoidoscopy or colonoscopy. You may have a sigmoidoscopy every 5 years or a colonoscopy every 10 years starting at age 87.  Hepatitis C blood test.  Hepatitis B blood test.  Sexually transmitted disease (STD) testing.  Diabetes screening. This is done by checking your blood sugar (glucose) after you have not eaten for a while (fasting). You may have this done every 1-3 years.  Bone density scan. This is done to screen for osteoporosis. You may have this done starting at age 65.  Mammogram. This may be done every 1-2 years. Talk to your health care provider about how often you should have regular mammograms. Talk with your health care provider about your test results, treatment  options, and if necessary, the need for more tests. Vaccines  Your health care provider may recommend certain vaccines, such as:  Influenza vaccine. This is recommended every year.  Tetanus,  diphtheria, and acellular pertussis (Tdap, Td) vaccine. You may need a Td booster every 10 years.  Zoster vaccine. You may need this after age 62.  Pneumococcal 13-valent conjugate (PCV13) vaccine. One dose is recommended after age 2.  Pneumococcal polysaccharide (PPSV23) vaccine. One dose is recommended after age 31. Talk to your health care provider about which screenings and vaccines you need and how often you need them. This information is not intended to replace advice given to you by your health care provider. Make sure you discuss any questions you have with your health care provider. Document Released: 11/03/2015 Document Revised: 06/26/2016 Document Reviewed: 08/08/2015 Elsevier Interactive Patient Education  2017 [place] Prevention in the Home Falls can cause injuries. They can happen to people of all ages. There are many things you can do to make your home safe and to help prevent falls. What can I do on the outside of my home?  Regularly fix the edges of walkways and driveways and fix any cracks.  Remove anything that might make you trip as you walk through a door, such as a raised step or threshold.  Trim any bushes or trees on the path to your home.  Use bright outdoor lighting.  Clear any walking paths of anything that might make someone trip, such as rocks or tools.  Regularly check to see if handrails are loose or broken. Make sure that both sides of any steps have handrails.  Any raised decks and porches should have guardrails on the edges.  Have any leaves, snow, or ice cleared regularly.  Use sand or salt on walking paths during winter.  Clean up any spills in your garage right away. This includes oil or grease spills. What can I do in the bathroom?  Use night lights.  Install grab bars by the toilet and in the tub and shower. Do not use towel bars as grab bars.  Use non-skid mats or decals in the tub or shower.  If you need to sit down in  the shower, use a plastic, non-slip stool.  Keep the floor dry. Clean up any water that spills on the floor as soon as it happens.  Remove soap buildup in the tub or shower regularly.  Attach bath mats securely with double-sided non-slip rug tape.  Do not have throw rugs and other things on the floor that can make you trip. What can I do in the bedroom?  Use night lights.  Make sure that you have a light by your bed that is easy to reach.  Do not use any sheets or blankets that are too big for your bed. They should not hang down onto the floor.  Have a firm chair that has side arms. You can use this for support while you get dressed.  Do not have throw rugs and other things on the floor that can make you trip. What can I do in the kitchen?  Clean up any spills right away.  Avoid walking on wet floors.  Keep items that you use a lot in easy-to-reach places.  If you need to reach something above you, use a strong step stool that has a grab bar.  Keep electrical cords out of the way.  Do not use floor polish or wax that makes floors slippery.  If you must use wax, use non-skid floor wax.  Do not have throw rugs and other things on the floor that can make you trip. What can I do with my stairs?  Do not leave any items on the stairs.  Make sure that there are handrails on both sides of the stairs and use them. Fix handrails that are broken or loose. Make sure that handrails are as long as the stairways.  Check any carpeting to make sure that it is firmly attached to the stairs. Fix any carpet that is loose or worn.  Avoid having throw rugs at the top or bottom of the stairs. If you do have throw rugs, attach them to the floor with carpet tape.  Make sure that you have a light switch at the top of the stairs and the bottom of the stairs. If you do not have them, ask someone to add them for you. What else can I do to help prevent falls?  Wear shoes that:  Do not have high  heels.  Have rubber bottoms.  Are comfortable and fit you well.  Are closed at the toe. Do not wear sandals.  If you use a stepladder:  Make sure that it is fully opened. Do not climb a closed stepladder.  Make sure that both sides of the stepladder are locked into place.  Ask someone to hold it for you, if possible.  Clearly [place] and make sure that you can see:  Any grab bars or handrails.  First and last steps.  Where the edge of each step is.  Use tools that help you move around (mobility aids) if they are needed. These include:  Canes.  Walkers.  Scooters.  Crutches.  Turn on the lights when you go into a dark area. Replace any light bulbs as soon as they burn out.  Set up your furniture so you have a clear path. Avoid moving your furniture around.  If any of your floors are uneven, fix them.  If there are any pets around you, be aware of where they are.  Review your medicines with your doctor. Some medicines can make you feel dizzy. This can increase your chance of falling. Ask your doctor what other things that you can do to help prevent falls. This information is not intended to replace advice given to you by your health care provider. Make sure you discuss any questions you have with your health care provider. Document Released: 08/03/2009 Document Revised: 03/14/2016 Document Reviewed: 11/11/2014 Elsevier Interactive Patient Education  2017 [employer].

## 2020-11-22 NOTE — Progress Notes (Signed)
Subjective:   Dawn Wang is a 75 y.o. female who presents for Medicare Annual (Subsequent) preventive examination.  Review of Systems     Cardiac Risk Factors include: advanced age (>42men, >72 women);dyslipidemia     Objective:    Today's Vitals   11/22/20 1022  BP: 128/72  Pulse: 65  Temp: 97.7 F (36.5 C)  TempSrc: Oral  Weight: 147 lb (66.7 kg)  Height: 5' 2.2" (1.58 m)   Body mass index is 26.71 kg/m.  Advanced Directives 11/22/2020 06/30/2019 09/15/2018  Does Patient Have a Medical Advance Directive? No No No  Would patient like information on creating a medical advance directive? Yes (MAU/Ambulatory/Procedural Areas - Information given) - Yes (MAU/Ambulatory/Procedural Areas - Information given)    Current Medications (verified) No outpatient encounter medications on file as of 11/22/2020.   No facility-administered encounter medications on file as of 11/22/2020.    Allergies (verified) Patient has no known allergies.   History: History reviewed. No pertinent past medical history. History reviewed. No pertinent surgical history. Family History  Problem Relation Age of Onset  . Cancer Mother   . Diabetes Father   . Hypertension Father    Social History   Socioeconomic History  . Marital status: Married    Spouse name: Not on file  . Number of children: Not on file  . Years of education: Not on file  . Highest education level: Not on file  Occupational History  . Occupation: retired  Tobacco Use  . Smoking status: Never Smoker  . Smokeless tobacco: Never Used  Vaping Use  . Vaping Use: Never used  Substance and Sexual Activity  . Alcohol use: Not Currently  . Drug use: Not Currently  . Sexual activity: Yes  Other Topics Concern  . Not on file  Social History Narrative  . Not on file   Social Determinants of Health   Financial Resource Strain: Low Risk   . Difficulty of Paying Living Expenses: Not hard at all  Food Insecurity: No Food  Insecurity  . Worried About Charity fundraiser in the Last Year: Never true  . Ran Out of Food in the Last Year: Never true  Transportation Needs: No Transportation Needs  . Lack of Transportation (Medical): No  . Lack of Transportation (Non-Medical): No  Physical Activity: Inactive  . Days of Exercise per Week: 0 days  . Minutes of Exercise per Session: 0 min  Stress: No Stress Concern Present  . Feeling of Stress : Not at all  Social Connections: Not on file    Tobacco Counseling Counseling given: Not Answered   Clinical Intake:  Pre-visit preparation completed: Yes  Pain : No/denies pain     Nutritional Status: BMI 25 -29 Overweight Nutritional Risks: None Diabetes: No  How often do you need to have someone help you when you read instructions, pamphlets, or other written materials from your doctor or pharmacy?: 1 - Never What is the last grade level you completed in school?: masters degree  Diabetic? no  Interpreter Needed?: No  Information entered by :: NAllen LPN   Activities of Daily Living In your present state of health, do you have any difficulty performing the following activities: 11/22/2020 11/22/2020  Hearing? N N  Vision? N N  Difficulty concentrating or making decisions? N N  Walking or climbing stairs? N N  Dressing or bathing? N N  Doing errands, shopping? N N  Preparing Food and eating ? N -  Using the  Toilet? N -  In the past six months, have you accidently leaked urine? N -  Do you have problems with loss of bowel control? N -  Managing your Medications? N -  Managing your Finances? N -  Housekeeping or managing your Housekeeping? N -  Some recent data might be hidden    Patient Care Team: [name], FNP as PCP - General (General Practice)  Indicate any recent Medical Services you may have received from other than [facility] providers in the past year (date may be approximate).     Assessment:   This is a routine wellness examination for  [name].  Hearing/Vision screen No exam data present  Dietary issues and exercise activities discussed: Current Exercise Habits: The patient does not participate in regular exercise at present (bowls once a week)  Goals    . Patient Stated     06/30/2019, to get colonoscopy    . Patient Stated     11/22/2020, keep A1C down      Depression Screen PHQ 2/9 Scores 11/22/2020 11/22/2020 11/15/2019 06/30/2019 04/28/2019 10/28/2018 09/15/2018  PHQ - 2 Score 0 0 0 0 0 0 0  PHQ- 9 Score - - - 1 - - -    Fall Risk Fall Risk  11/22/2020 11/22/2020 11/15/2019 06/30/2019 04/28/2019  Falls in the past year? 0 0 0 0 0  Number falls in past yr: - - - - -  Injury with Fall? - - - - -  Risk for fall due to : No Fall Risks - - - -  Follow up Falls evaluation completed;Education provided;Falls prevention discussed - - Falls evaluation completed;Education provided;Falls prevention discussed -    FALL RISK PREVENTION PERTAINING TO THE HOME:  Any stairs in or around the home? Yes  If so, are there any without handrails? No  Home free of loose throw rugs in walkways, pet beds, electrical cords, etc? Yes  Adequate lighting in your home to reduce risk of falls? Yes   ASSISTIVE DEVICES UTILIZED TO PREVENT FALLS:  Life alert? No  Use of a cane, walker or w/c? No  Grab bars in the bathroom? No  Shower chair or bench in shower? No  Elevated toilet seat or a handicapped toilet? No   TIMED UP AND GO:  Was the test performed? No .    Gait steady and fast without use of assistive device  Cognitive Function:     6CIT Screen 11/22/2020 06/30/2019 09/15/2018  What Year? 0 points 0 points 0 points  What month? 0 points 0 points 0 points  What time? 0 points 0 points 0 points  Count back from 20 0 points 0 points 0 points  Months in reverse 0 points 0 points 0 points  Repeat phrase 2 points 0 points 0 points  Total Score 2 0 0    Immunizations Immunization History  Administered Date(s) Administered  . Fluad  Quad(high Dose 65+) 07/03/2019  . Influenza, High Dose Seasonal PF 08/12/2018  . Influenza-Unspecified 07/21/2018, 08/07/2019  . PFIZER(Purple Top)SARS-COV-2 Vaccination 11/11/2019, 12/03/2019  . Pneumococcal Conjugate-13 10/29/2018  . Zoster Recombinat (Shingrix) 07/03/2019, 09/06/2019    TDAP status: Up to date  Flu Vaccine status: Up to date  Pneumococcal vaccine status: Completed during today's visit.  Covid-19 vaccine status: Completed vaccines  Qualifies for Shingles Vaccine? Yes   Zostavax completed No   Shingrix Completed?: Yes  Screening Tests Health Maintenance  Topic Date Due  . INFLUENZA VACCINE  05/21/2020  . COVID-19 Vaccine (  3 - Booster for [employer] series) 06/01/2020  . MAMMOGRAM  10/16/2022  . TETANUS/TDAP  10/21/2022  . COLONOSCOPY (Pts 45-77yrs Insurance coverage will need to be confirmed)  11/16/2024  . DEXA SCAN  Completed  . Hepatitis C Screening  Completed  . PNA vac Low Risk Adult  Completed    Health Maintenance  Health Maintenance Due  Topic Date Due  . INFLUENZA VACCINE  05/21/2020  . COVID-19 Vaccine (3 - Booster for Pfizer series) 06/01/2020    Colorectal cancer screening: Type of screening: Colonoscopy. Completed 11/17/2019. Repeat every 5 years  Mammogram status: Completed 10/16/2020. Repeat every year  Bone Density status: Completed 11/25/2017. Results reflect: Bone density results: NORMAL. Repeat every 0 years.  Lung Cancer Screening: (Low Dose CT Chest recommended if Age 34-80 years, 30 pack-year currently smoking OR have quit w/in 15years.) does not qualify.   Lung Cancer Screening Referral: no  Additional Screening:  Hepatitis C Screening: does qualify; Completed 10/28/2018  Vision Screening: Recommended annual ophthalmology exams for early detection of glaucoma and other disorders of the eye. Is the patient up to date with their annual eye exam?  Yes  Who is the provider or what is the name of the office in which the patient  attends annual eye exams? [facility] If pt is not established with a provider, would they like to be referred to a provider to establish care? No .   Dental Screening: Recommended annual dental exams for proper oral hygiene  Community Resource Referral / Chronic Care Management: CRR required this visit?  No   CCM required this visit?  No      Plan:     I have personally reviewed and noted the following in the patient's chart:   . Medical and social history . Use of alcohol, tobacco or illicit drugs  . Current medications and supplements . Functional ability and status . Nutritional status . Physical activity . Advanced directives . List of other physicians . Hospitalizations, surgeries, and ER visits in previous 12 months . Vitals . Screenings to include cognitive, depression, and falls . Referrals and appointments  In addition, I have reviewed and discussed with patient certain preventive protocols, quality metrics, and best practice recommendations. A written personalized care plan for preventive services as well as general preventive health recommendations were provided to patient.     Kellie Simmering, LPN   05/28/4165   Nurse Notes:

## 2020-11-22 NOTE — Progress Notes (Signed)
I,Yamilka Roman [employer] as a Education administrator for [employer], FNP.,have documented all relevant documentation on the behalf of [name], FNP,as directed by  [name], FNP while in the presence of [name], [place]. This visit occurred during the SARS-CoV-2 public health emergency.  Safety protocols were in place, including screening questions prior to the visit, additional usage of staff PPE, and extensive cleaning of exam room while observing appropriate contact time as indicated for disinfecting solutions.  Subjective:     Patient ID: Dawn Wang , female    DOB: [birth year] , 75 y.o.   MRN: [id number]   Chief Complaint  Patient presents with  . abnormal glucose  . Hyperlipidemia    HPI  Patient presents today for a f/u on abnormal glucose and elevated LDL. She also had her AWV.  She has been taking a diabetes prevention class - October 2021 - October 2022, via zoom. She also does bowling every Monday.    Wt Readings from Last 3 Encounters: 11/22/20 : 147 lb (66.7 kg) 11/22/20 : 147 lb (66.7 kg) 05/15/20 : 155 lb 6.4 oz (70.5 kg)   Hyperlipidemia This is a chronic problem. The current episode started more than 1 year ago. The problem is controlled. She has no history of chronic renal disease. There are no known factors aggravating her hyperlipidemia. Pertinent negatives include no chest pain. There are no compliance problems.   Diabetes She presents for her follow-up diabetic visit. Diabetes type: prediabetes. Her disease course has been stable. There are no hypoglycemic associated symptoms. Pertinent negatives for hypoglycemia include no dizziness or headaches. There are no diabetic associated symptoms. Pertinent negatives for diabetes include no chest pain. There are no hypoglycemic complications. There are no diabetic complications. Risk factors for coronary artery disease include sedentary lifestyle. Compliance with diabetes treatment: no medications. When asked about meal  planning, she reported none. She has not had a previous visit with a dietitian. She rarely participates in exercise. She does not see a podiatrist.Eye exam is not current.     History reviewed. No pertinent past medical history.   Family History  Problem Relation Age of Onset  . Cancer Mother   . Diabetes Father   . Hypertension Father     No current outpatient medications on file.   No Known Allergies   Review of Systems  Constitutional: Negative.   Respiratory: Negative.   Cardiovascular: Negative for chest pain, palpitations and leg swelling.  Neurological: Negative for dizziness and headaches.  Psychiatric/Behavioral: Negative.      Today's Vitals   11/22/20 1013  BP: 128/72  Pulse: 65  Temp: 97.7 F (36.5 C)  TempSrc: Oral  Weight: 147 lb (66.7 kg)  Height: 5' 2.2" (1.58 m)  PainSc: 0-No pain   Body mass index is 26.71 kg/m.   Objective:  Physical Exam Constitutional:      General: She is not in acute distress.    Appearance: Normal appearance.  Cardiovascular:     Rate and Rhythm: Normal rate and regular rhythm.     Pulses: Normal pulses.     Heart sounds: Normal heart sounds. No murmur heard.   Pulmonary:     Effort: Pulmonary effort is normal. No respiratory distress.     Breath sounds: Normal breath sounds. No wheezing.  Skin:    Capillary Refill: Capillary refill takes less than 2 seconds.  Neurological:     General: No focal deficit present.     Mental Status: She is alert and oriented  to person, place, and time.     Cranial Nerves: No cranial nerve deficit.  Psychiatric:        Mood and Affect: Mood normal.        Behavior: Behavior normal.        Thought Content: Thought content normal.        Judgment: Judgment normal.         Assessment And Plan:     1. Elevated LDL cholesterol level  Chronic, will check lipid panel  Was slightly elevated at last visit - Lipid panel - BMP8+eGFR  2. Abnormal glucose  Chronic,  stable  Continue with the diabetes prevention class at your church - Hemoglobin A1c     Patient was given opportunity to ask questions. Patient verbalized understanding of the plan and was able to repeat key elements of the plan. All questions were answered to their satisfaction.  [name], FNP   I, [name], FNP, have reviewed all documentation for this visit. The documentation on 11/22/20 for the exam, diagnosis, procedures, and orders are all accurate and complete .  THE PATIENT IS ENCOURAGED TO PRACTICE SOCIAL DISTANCING DUE TO THE COVID-19 PANDEMIC.

## 2020-11-23 LAB — BMP8+EGFR
BUN/Creatinine Ratio: 14 (ref 12–28)
BUN: 12 mg/dL (ref 8–27)
CO2: 25 mmol/L (ref 20–29)
Calcium: 9.2 mg/dL (ref 8.7–10.3)
Chloride: 107 mmol/L — ABNORMAL HIGH (ref 96–106)
Creatinine, Ser: 0.84 mg/dL (ref 0.57–1.00)
GFR calc Af Amer: 80 mL/min/{1.73_m2} (ref >59)
GFR calc non Af Amer: 70 mL/min/{1.73_m2} (ref >59)
Glucose: 81 mg/dL (ref 65–99)
Potassium: 4.6 mmol/L (ref 3.5–5.2)
Sodium: 141 mmol/L (ref 134–144)

## 2020-11-23 LAB — LIPID PANEL
Chol/HDL Ratio: 3.8 ratio (ref 0.0–4.4)
Cholesterol, Total: 179 mg/dL (ref 100–199)
HDL: 47 mg/dL (ref >39)
LDL Chol Calc (NIH): 112 mg/dL — ABNORMAL HIGH (ref 0–99)
Triglycerides: 113 mg/dL (ref 0–149)
VLDL Cholesterol Cal: 20 mg/dL (ref 5–40)

## 2020-11-23 LAB — HEMOGLOBIN A1C
Est. average glucose Bld gHb Est-mCnc: 117 mg/dL
Hgb A1c MFr Bld: 5.7 % — ABNORMAL HIGH (ref 4.8–5.6)

## 2021-01-23 ENCOUNTER — Encounter: Payer: Medicare PPO | Admitting: Nurse Practitioner

## 2021-05-01 ENCOUNTER — Ambulatory Visit (INDEPENDENT_AMBULATORY_CARE_PROVIDER_SITE_OTHER): Payer: Medicare PPO | Admitting: Nurse Practitioner

## 2021-05-01 ENCOUNTER — Other Ambulatory Visit: Payer: Self-pay

## 2021-05-01 VITALS — BP 132/72 | HR 59 | Temp 98.2°F | Ht 60.6 in | Wt 144.6 lb

## 2021-05-01 DIAGNOSIS — R7303 Prediabetes: Secondary | ICD-10-CM

## 2021-05-01 DIAGNOSIS — E663 Overweight: Secondary | ICD-10-CM | POA: Diagnosis not present

## 2021-05-01 DIAGNOSIS — H6122 Impacted cerumen, left ear: Secondary | ICD-10-CM

## 2021-05-01 DIAGNOSIS — Z79899 Other long term (current) drug therapy: Secondary | ICD-10-CM

## 2021-05-01 DIAGNOSIS — Z0000 Encounter for general adult medical examination without abnormal findings: Secondary | ICD-10-CM | POA: Diagnosis not present

## 2021-05-01 DIAGNOSIS — Z6827 Body mass index (BMI) 27.0-27.9, adult: Secondary | ICD-10-CM

## 2021-05-01 DIAGNOSIS — E782 Mixed hyperlipidemia: Secondary | ICD-10-CM

## 2021-05-01 NOTE — Progress Notes (Signed)
I,Tianna Badgett,acting as a Education administrator for [employer], FNP.,have documented all relevant documentation on the behalf of [name], FNP,as directed by  [name], FNP while in the presence of [name], [place].  This visit occurred during the SARS-CoV-2 public health emergency.  Safety protocols were in place, including screening questions prior to the visit, additional usage of staff PPE, and extensive cleaning of exam room while observing appropriate contact time as indicated for disinfecting solutions.  Subjective:     Patient ID: Dawn Wang , female    DOB: Mar 08, 1946 , 75 y.o.   MRN: [id number]   Chief Complaint  Patient presents with   Annual Exam    HPI  Here for HM.  She is finishing her diabetes prevention class with her [employer] .   Wt Readings from Last 3 Encounters: 05/01/21 : 144 lb 9.6 oz (65.6 kg) 11/22/20 : 147 lb (66.7 kg) 11/22/20 : 147 lb (66.7 kg)    Diabetes She presents for her follow-up diabetic visit. Diabetes type: prediabetes. There are no hypoglycemic associated symptoms. Pertinent negatives for hypoglycemia include no dizziness or headaches. There are no diabetic associated symptoms.    No past medical history on file.   Family History  Problem Relation Age of Onset   Cancer Mother    Diabetes Father    Hypertension Father     No current outpatient medications on file.   No Known Allergies    The patient states she uses status post hysterectomy for birth control.  No LMP recorded. Patient has had a hysterectomy.. Negative for Dysmenorrhea and Negative for Menorrhagia. Negative for: breast discharge, breast lump(s), breast pain and breast self exam. Associated symptoms include abnormal vaginal bleeding. Pertinent negatives include abnormal bleeding (hematology), anxiety, decreased libido, depression, difficulty falling sleep, dyspareunia, history of infertility, nocturia, sexual dysfunction, sleep disturbances, urinary incontinence,  urinary urgency, vaginal discharge and vaginal itching. Diet eating mostly vegetables and protein and limiting carbs. She is logging her food. The patient states her exercise level is moderate with mowing the yard with a push mower and walking. She also tries to get 150 minutes of exercise a week will often have 350 minutes.   The patient's tobacco use is:  Social History   Tobacco Use  Smoking Status Never  Smokeless Tobacco Never   She has been exposed to passive smoke. The patient's alcohol use is:  Social History   Substance and Sexual Activity  Alcohol Use Not Currently    Review of Systems  Constitutional: Negative.   HENT: Negative.    Eyes: Negative.   Respiratory: Negative.    Cardiovascular: Negative.   Gastrointestinal: Negative.   Endocrine: Negative.   Genitourinary: Negative.   Musculoskeletal: Negative.   Skin: Negative.   Allergic/Immunologic: Negative.   Neurological: Negative.  Negative for dizziness and headaches.  Hematological: Negative.   Psychiatric/Behavioral: Negative.      Today's Vitals   05/01/21 0834  BP: 132/72  Pulse: (!) 59  Temp: 98.2 F (36.8 C)  TempSrc: Oral  Weight: 144 lb 9.6 oz (65.6 kg)  Height: 5' 0.6" (1.539 m)   Body mass index is 27.68 kg/m.  Wt Readings from Last 3 Encounters:  05/01/21 144 lb 9.6 oz (65.6 kg)  11/22/20 147 lb (66.7 kg)  11/22/20 147 lb (66.7 kg)    Objective:  Physical Exam Constitutional:      General: She is not in acute distress.    Appearance: Normal appearance. She is well-developed.  HENT:  Head: Normocephalic and atraumatic.     Right Ear: Hearing, tympanic membrane, ear canal and external ear normal. There is no impacted cerumen.     Left Ear: Hearing and external ear normal. There is impacted cerumen.     Nose:     Comments: Deferred - masked     Mouth/Throat:     Comments: Deferred - masked Eyes:     General: Lids are normal.     Conjunctiva/sclera: Conjunctivae normal.      Pupils: Pupils are equal, round, and reactive to light.     Funduscopic exam:    Right eye: No papilledema.        Left eye: No papilledema.  Neck:     Thyroid: No thyroid mass.     Vascular: No carotid bruit.  Cardiovascular:     Rate and Rhythm: Normal rate and regular rhythm.     Pulses: Normal pulses.     Heart sounds: Normal heart sounds. No murmur heard. Pulmonary:     Effort: Pulmonary effort is normal. No respiratory distress.     Breath sounds: Normal breath sounds. No wheezing.  Chest:  Breasts:    Right: Normal. No mass or tenderness.     Left: Normal. No mass or tenderness.  Abdominal:     General: Abdomen is flat. Bowel sounds are normal. There is no distension.     Palpations: Abdomen is soft.     Tenderness: There is no abdominal tenderness.  Musculoskeletal:        General: No swelling or tenderness. Normal range of motion.     Cervical back: Full passive range of motion without pain, normal range of motion and neck supple.     Right lower leg: No edema.     Left lower leg: No edema.  Skin:    General: Skin is warm and dry.     Capillary Refill: Capillary refill takes less than 2 seconds.  Neurological:     General: No focal deficit present.     Mental Status: She is alert and oriented to person, place, and time.     Cranial Nerves: No cranial nerve deficit.     Sensory: No sensory deficit.  Psychiatric:        Mood and Affect: Mood normal.        Behavior: Behavior normal.        Thought Content: Thought content normal.        Judgment: Judgment normal.        Assessment And Plan:     1. Encounter for general adult medical examination w/o abnormal findings Behavior modifications discussed and diet history reviewed.   Pt will continue to exercise regularly and modify diet with low GI, plant based foods and decrease intake of processed foods.  Recommend intake of daily multivitamin, Vitamin D, and calcium.  Recommend mammogram and colonoscopy (up to  date) for preventive screenings, as well as recommend immunizations that include influenza, TDAP, and Shingles  2. Mixed hyperlipidemia Chronic, controlled Continue with current medications - Lipid panel  3. Prediabetes Chronic, controlled No current medications Encouraged to limit intake of sugary foods and drinks Encouraged to continue physical activity to 150 minutes per week - Hemoglobin A1c - CMP14+EGFR  4. Impacted cerumen of left ear Water lavage done with good results - EAR CERUMEN REMOVAL  5. Overweight with body mass index (BMI) of 27 to 27.9 in adult She is encouraged to strive for BMI less than 26 to decrease cardiac  risk. Advised to aim for at least 150 minutes of exercise per week.  6. Other long term (current) drug therapy - CBC      Patient was given opportunity to ask questions. Patient verbalized understanding of the plan and was able to repeat key elements of the plan. All questions were answered to their satisfaction.   [name], FNP   I, [name], FNP, have reviewed all documentation for this visit. The documentation on 05/23/21 for the exam, diagnosis, procedures, and orders are all accurate and complete.  THE PATIENT IS ENCOURAGED TO PRACTICE SOCIAL DISTANCING DUE TO THE COVID-19 PANDEMIC.

## 2021-05-01 NOTE — Patient Instructions (Signed)
Health Maintenance, Female Adopting a healthy lifestyle and getting preventive care are important in promoting health and wellness. Ask your health care provider about: The right schedule for you to have regular tests and exams. Things you can do on your own to prevent diseases and keep yourself healthy. What should I know about diet, weight, and exercise? Eat a healthy diet  Eat a diet that includes plenty of vegetables, fruits, low-fat dairy products, and lean protein. Do not eat a lot of foods that are high in solid fats, added sugars, or sodium.  Maintain a healthy weight Body mass index (BMI) is used to identify weight problems. It estimates body fat based on height and weight. Your health care provider can help determineyour BMI and help you achieve or maintain a healthy weight. Get regular exercise Get regular exercise. This is one of the most important things you can do for your health. Most adults should: Exercise for at least 150 minutes each week. The exercise should increase your heart rate and make you sweat (moderate-intensity exercise). Do strengthening exercises at least twice a week. This is in addition to the moderate-intensity exercise. Spend less time sitting. Even light physical activity can be beneficial. Watch cholesterol and blood lipids Have your blood tested for lipids and cholesterol at 73 years of age, then havethis test every 5 years. Have your cholesterol levels checked more often if: Your lipid or cholesterol levels are high. You are older than 73 years of age. You are at high risk for heart disease. What should I know about cancer screening? Depending on your health history and family history, you may need to have cancer screening at various ages. This may include screening for: Breast cancer. Cervical cancer. Colorectal cancer. Skin cancer. Lung cancer. What should I know about heart disease, diabetes, and high blood pressure? Blood pressure and heart  disease High blood pressure causes heart disease and increases the risk of stroke. This is more likely to develop in people who have high blood pressure readings, are of African descent, or are overweight. Have your blood pressure checked: Every 3-5 years if you are 18-39 years of age. Every year if you are 40 years old or older. Diabetes Have regular diabetes screenings. This checks your fasting blood sugar level. Have the screening done: Once every three years after age 40 if you are at a normal weight and have a low risk for diabetes. More often and at a younger age if you are overweight or have a high risk for diabetes. What should I know about preventing infection? Hepatitis B If you have a higher risk for hepatitis B, you should be screened for this virus. Talk with your health care provider to find out if you are at risk forhepatitis B infection. Hepatitis C Testing is recommended for: Everyone born from 1945 through 1965. Anyone with known risk factors for hepatitis C. Sexually transmitted infections (STIs) Get screened for STIs, including gonorrhea and chlamydia, if: You are sexually active and are younger than 73 years of age. You are older than 73 years of age and your health care provider tells you that you are at risk for this type of infection. Your sexual activity has changed since you were last screened, and you are at increased risk for chlamydia or gonorrhea. Ask your health care provider if you are at risk. Ask your health care provider about whether you are at high risk for HIV. Your health care provider may recommend a prescription medicine to help   prevent HIV infection. If you choose to take medicine to prevent HIV, you should first get tested for HIV. You should then be tested every 3 months for as long as you are taking the medicine. Pregnancy If you are about to stop having your period (premenopausal) and you may become pregnant, seek counseling before you get  pregnant. Take 400 to 800 micrograms (mcg) of folic acid every day if you become pregnant. Ask for birth control (contraception) if you want to prevent pregnancy. Osteoporosis and menopause Osteoporosis is a disease in which the bones lose minerals and strength with aging. This can result in bone fractures. If you are 65 years old or older, or if you are at risk for osteoporosis and fractures, ask your health care provider if you should: Be screened for bone loss. Take a calcium or vitamin D supplement to lower your risk of fractures. Be given hormone replacement therapy (HRT) to treat symptoms of menopause. Follow these instructions at home: Lifestyle Do not use any products that contain nicotine or tobacco, such as cigarettes, e-cigarettes, and chewing tobacco. If you need help quitting, ask your health care provider. Do not use street drugs. Do not share needles. Ask your health care provider for help if you need support or information about quitting drugs. Alcohol use Do not drink alcohol if: Your health care provider tells you not to drink. You are pregnant, may be pregnant, or are planning to become pregnant. If you drink alcohol: Limit how much you use to 0-1 drink a day. Limit intake if you are breastfeeding. Be aware of how much alcohol is in your drink. In the [place] one drink equals one 12 oz bottle of beer (355 mL), one 5 oz glass of wine (148 mL), or one 1 oz glass of hard liquor (44 mL). General instructions Schedule regular health, dental, and eye exams. Stay current with your vaccines. Tell your health care provider if: You often feel depressed. You have ever been abused or do not feel safe at home. Summary Adopting a healthy lifestyle and getting preventive care are important in promoting health and wellness. Follow your health care provider's instructions about healthy diet, exercising, and getting tested or screened for diseases. Follow your health care provider's  instructions on monitoring your cholesterol and blood pressure. This information is not intended to replace advice given to you by your health care provider. Make sure you discuss any questions you have with your healthcare provider. Document Revised: 09/30/2018 Document Reviewed: 09/30/2018 Elsevier Patient Education  2022 Elsevier Inc.  

## 2021-05-02 LAB — HEMOGLOBIN A1C
Est. average glucose Bld gHb Est-mCnc: 123 mg/dL
Hgb A1c MFr Bld: 5.9 % — ABNORMAL HIGH (ref 4.8–5.6)

## 2021-05-02 LAB — CBC
Hematocrit: 38.6 % (ref 34.0–46.6)
Hemoglobin: 12.7 g/dL (ref 11.1–15.9)
MCH: 30.2 pg (ref 26.6–33.0)
MCHC: 32.9 g/dL (ref 31.5–35.7)
MCV: 92 fL (ref 79–97)
Platelets: 199 10*3/uL (ref 150–450)
RBC: 4.2 x10E6/uL (ref 3.77–5.28)
RDW: 12.6 % (ref 11.7–15.4)
WBC: 5.2 10*3/uL (ref 3.4–10.8)

## 2021-05-02 LAB — CMP14+EGFR
ALT: 10 IU/L (ref 0–32)
AST: 14 IU/L (ref 0–40)
Albumin/Globulin Ratio: 1.7 (ref 1.2–2.2)
Albumin: 4.3 g/dL (ref 3.7–4.7)
Alkaline Phosphatase: 82 IU/L (ref 44–121)
BUN/Creatinine Ratio: 12 (ref 12–28)
BUN: 11 mg/dL (ref 8–27)
Bilirubin Total: 0.4 mg/dL (ref 0.0–1.2)
CO2: 23 mmol/L (ref 20–29)
Calcium: 9.2 mg/dL (ref 8.7–10.3)
Chloride: 101 mmol/L (ref 96–106)
Creatinine, Ser: 0.93 mg/dL (ref 0.57–1.00)
Globulin, Total: 2.6 g/dL (ref 1.5–4.5)
Glucose: 84 mg/dL (ref 65–99)
Potassium: 4.7 mmol/L (ref 3.5–5.2)
Sodium: 141 mmol/L (ref 134–144)
Total Protein: 6.9 g/dL (ref 6.0–8.5)
eGFR: 65 mL/min/{1.73_m2} (ref >59)

## 2021-05-02 LAB — LIPID PANEL
Chol/HDL Ratio: 3.6 ratio (ref 0.0–4.4)
Cholesterol, Total: 186 mg/dL (ref 100–199)
HDL: 52 mg/dL (ref >39)
LDL Chol Calc (NIH): 118 mg/dL — ABNORMAL HIGH (ref 0–99)
Triglycerides: 89 mg/dL (ref 0–149)
VLDL Cholesterol Cal: 16 mg/dL (ref 5–40)

## 2021-05-23 ENCOUNTER — Encounter: Payer: Self-pay | Admitting: Nurse Practitioner

## 2021-09-19 ENCOUNTER — Other Ambulatory Visit: Payer: Self-pay | Admitting: Internal Medicine

## 2021-09-19 DIAGNOSIS — Z1231 Encounter for screening mammogram for malignant neoplasm of breast: Secondary | ICD-10-CM

## 2021-10-23 ENCOUNTER — Encounter: Payer: Self-pay | Admitting: Nurse Practitioner

## 2021-10-24 ENCOUNTER — Other Ambulatory Visit: Payer: Self-pay

## 2021-10-24 ENCOUNTER — Ambulatory Visit
Admission: RE | Admit: 2021-10-24 | Discharge: 2021-10-24 | Disposition: A | Discharge status: Home / Self Care | Payer: Medicare PPO | Source: Ambulatory Visit

## 2021-10-24 DIAGNOSIS — Z1231 Encounter for screening mammogram for malignant neoplasm of breast: Secondary | ICD-10-CM

## 2021-11-01 ENCOUNTER — Ambulatory Visit: Payer: Medicare PPO | Admitting: Nurse Practitioner

## 2021-11-29 ENCOUNTER — Encounter: Payer: Self-pay | Admitting: Nurse Practitioner

## 2021-11-29 ENCOUNTER — Other Ambulatory Visit: Payer: Self-pay

## 2021-11-29 ENCOUNTER — Ambulatory Visit: Payer: Medicare PPO | Admitting: Nurse Practitioner

## 2021-11-29 ENCOUNTER — Ambulatory Visit: Payer: Medicare PPO

## 2021-11-29 ENCOUNTER — Ambulatory Visit (INDEPENDENT_AMBULATORY_CARE_PROVIDER_SITE_OTHER): Payer: Medicare PPO

## 2021-11-29 VITALS — BP 148/70 | HR 66 | Temp 98.4°F | Ht 60.6 in | Wt 148.0 lb

## 2021-11-29 VITALS — BP 128/66 | HR 66 | Temp 98.4°F | Ht 60.6 in | Wt 148.4 lb

## 2021-11-29 DIAGNOSIS — E782 Mixed hyperlipidemia: Secondary | ICD-10-CM | POA: Diagnosis not present

## 2021-11-29 DIAGNOSIS — Z0000 Encounter for general adult medical examination without abnormal findings: Secondary | ICD-10-CM

## 2021-11-29 DIAGNOSIS — Z23 Encounter for immunization: Secondary | ICD-10-CM

## 2021-11-29 DIAGNOSIS — R7303 Prediabetes: Secondary | ICD-10-CM

## 2021-11-29 NOTE — Patient Instructions (Signed)

## 2021-11-29 NOTE — Progress Notes (Signed)
This visit occurred during the SARS-CoV-2 public health emergency.  Safety protocols were in place, including screening questions prior to the visit, additional usage of staff PPE, and extensive cleaning of exam room while observing appropriate contact time as indicated for disinfecting solutions.  Subjective:   Dawn Wang is a 76 y.o. female who presents for Medicare Annual (Subsequent) preventive examination.  Review of Systems     Cardiac Risk Factors include: advanced age (>76men, >36 women)     Objective:    Today's Vitals   11/29/21 0931 11/29/21 0955  BP: (!) 148/70 128/66  Pulse: 66   Temp: 98.4 F (36.9 C)   TempSrc: Oral   SpO2: 98%   Weight: 148 lb 6.4 oz (67.3 kg)   Height: 5' 0.6" (1.539 m)    Body mass index is 28.41 kg/m.  Advanced Directives 11/29/2021 11/22/2020 06/30/2019 09/15/2018  Does Patient Have a Medical Advance Directive? No No No No  Would patient like information on creating a medical advance directive? No - Patient declined Yes (MAU/Ambulatory/Procedural Areas - Information given) - Yes (MAU/Ambulatory/Procedural Areas - Information given)    Current Medications (verified) No outpatient encounter medications on file as of 11/29/2021.   No facility-administered encounter medications on file as of 11/29/2021.    Allergies (verified) Patient has no known allergies.   History: History reviewed. No pertinent past medical history. History reviewed. No pertinent surgical history. Family History  Problem Relation Age of Onset   Cancer Mother    Diabetes Father    Hypertension Father    Social History   Socioeconomic History   Marital status: Married    Spouse name: Not on file   Number of children: Not on file   Years of education: Not on file   Highest education level: Not on file  Occupational History   Occupation: retired  Tobacco Use   Smoking status: Never    Passive exposure: Past   Smokeless tobacco: Never  Vaping Use   Vaping  Use: Never used  Substance and Sexual Activity   Alcohol use: Not Currently   Drug use: Not Currently   Sexual activity: Yes  Other Topics Concern   Not on file  Social History Narrative   Not on file   Social Determinants of Health   Financial Resource Strain: Low Risk    Difficulty of Paying Living Expenses: Not hard at all  Food Insecurity: No Food Insecurity   Worried About Charity fundraiser in the Last Year: Never true   [place] in the Last Year: Never true  Transportation Needs: No Transportation Needs   Lack of Transportation (Medical): No   Lack of Transportation (Non-Medical): No  Physical Activity: Inactive   Days of Exercise per Week: 0 days   Minutes of Exercise per Session: 0 min  Stress: No Stress Concern Present   Feeling of Stress : Not at all  Social Connections: Not on file    Tobacco Counseling Counseling given: Not Answered   Clinical Intake:  Pre-visit preparation completed: Yes  Pain : No/denies pain     Nutritional Status: BMI 25 -29 Overweight Nutritional Risks: None Diabetes: No  How often do you need to have someone help you when you read instructions, pamphlets, or other written materials from your doctor or pharmacy?: 1 - Never What is the last grade level you completed in school?: 18 yrs  Diabetic? no  Interpreter Needed?: No  Information entered by :: NAllen LPN  Activities of Daily Living In your present state of health, do you have any difficulty performing the following activities: 11/29/2021  Hearing? N  Vision? N  Difficulty concentrating or making decisions? N  Walking or climbing stairs? N  Dressing or bathing? N  Doing errands, shopping? N  Preparing Food and eating ? N  Using the Toilet? N  In the past six months, have you accidently leaked urine? N  Do you have problems with loss of bowel control? N  Managing your Medications? N  Managing your Finances? N  Housekeeping or managing your Housekeeping? N   Some recent data might be hidden    Patient Care Team: [name], FNP as PCP - General (General Practice)  Indicate any recent Medical Services you may have received from other than [facility] providers in the past year (date may be approximate).     Assessment:   This is a routine wellness examination for [place].  Hearing/Vision screen Vision Screening - Comments:: Regular eye exams,   Dietary issues and exercise activities discussed: Current Exercise Habits: The patient does not participate in regular exercise at present (works out in the yard)   Goals Addressed             This Visit's Progress    Patient Stated       11/29/2021 maintain A1C       Depression Screen PHQ 2/9 Scores 11/29/2021 11/22/2020 11/22/2020 11/15/2019 06/30/2019 04/28/2019 10/28/2018  PHQ - 2 Score 0 0 0 0 0 0 0  PHQ- 9 Score - - - - 1 - -    Fall Risk Fall Risk  11/29/2021 11/22/2020 11/22/2020 11/15/2019 06/30/2019  Falls in the past year? 0 0 0 0 0  Number falls in past yr: - - - - -  Injury with Fall? - - - - -  Risk for fall due to : No Fall Risks No Fall Risks - - -  Follow up Falls evaluation completed;Education provided;Falls prevention discussed Falls evaluation completed;Education provided;Falls prevention discussed - - Falls evaluation completed;Education provided;Falls prevention discussed    FALL RISK PREVENTION PERTAINING TO THE HOME:  Any stairs in or around the home? Yes  If so, are there any without handrails? No  Home free of loose throw rugs in walkways, pet beds, electrical cords, etc? Yes  Adequate lighting in your home to reduce risk of falls? Yes   ASSISTIVE DEVICES UTILIZED TO PREVENT FALLS:  Life alert? No  Use of a cane, walker or w/c? No  Grab bars in the bathroom? Yes  Shower chair or bench in shower? No  Elevated toilet seat or a handicapped toilet? No   TIMED UP AND GO:  Was the test performed? No .    Gait steady and fast without use of assistive device  Cognitive  Function:     6CIT Screen 11/29/2021 11/22/2020 06/30/2019 09/15/2018  What Year? 0 points 0 points 0 points 0 points  What month? 0 points 0 points 0 points 0 points  What time? 0 points 0 points 0 points 0 points  Count back from 20 0 points 0 points 0 points 0 points  Months in reverse 0 points 0 points 0 points 0 points  Repeat phrase 2 points 2 points 0 points 0 points  Total Score 2 2 0 0    Immunizations Immunization History  Administered Date(s) Administered   Fluad Quad(high Dose 65+) 07/03/2019, 07/04/2021   Influenza, High Dose Seasonal PF 08/12/2018   Influenza-Unspecified 07/21/2018,  08/07/2019, 07/16/2020   PFIZER(Purple Top)SARS-COV-2 Vaccination 11/11/2019, 12/03/2019, 07/16/2020, 02/09/2021   Pfizer Covid-19 Vaccine Bivalent Booster 72yrs & up 07/04/2021   Pneumococcal Conjugate-13 10/29/2018   Pneumococcal Polysaccharide-23 11/29/2021   Zoster Recombinat (Shingrix) 07/03/2019, 09/06/2019    TDAP status: Up to date  Flu Vaccine status: Up to date  Pneumococcal vaccine status: Completed during today's visit.  Covid-19 vaccine status: Completed vaccines  Qualifies for Shingles Vaccine? Yes   Zostavax completed No   Shingrix Completed?: Yes  Screening Tests Health Maintenance  Topic Date Due   TETANUS/TDAP  10/21/2022   MAMMOGRAM  10/25/2023   COLONOSCOPY (Pts 45-68yrs Insurance coverage will need to be confirmed)  11/16/2024   Pneumonia Vaccine 33+ Years old  Completed   INFLUENZA VACCINE  Completed   DEXA SCAN  Completed   COVID-19 Vaccine  Completed   Hepatitis C Screening  Completed   Zoster Vaccines- Shingrix  Completed   HPV VACCINES  Aged Out    Health Maintenance  There are no preventive care reminders to display for this patient.   Colorectal cancer screening: Type of screening: Colonoscopy. Completed 11/17/2019. Repeat every 5 years  Mammogram status: Completed 10/24/2021. Repeat every year  Bone Density status: Completed 11/25/2017.   Lung  Cancer Screening: (Low Dose CT Chest recommended if Age 92-80 years, 30 pack-year currently smoking OR have quit w/in 15years.) does not qualify.   Lung Cancer Screening Referral: no  Additional Screening:  Hepatitis C Screening: does qualify; Completed 10/28/2018  Vision Screening: Recommended annual ophthalmology exams for early detection of glaucoma and other disorders of the eye. Is the patient up to date with their annual eye exam?  Yes  Who is the provider or what is the name of the office in which the patient attends annual eye exams? [facility] If pt is not established with a provider, would they like to be referred to a provider to establish care? No .   Dental Screening: Recommended annual dental exams for proper oral hygiene  Community Resource Referral / Chronic Care Management: CRR required this visit?  No   CCM required this visit?  No      Plan:     I have personally reviewed and noted the following in the patients chart:   Medical and social history Use of alcohol, tobacco or illicit drugs  Current medications and supplements including opioid prescriptions.  Functional ability and status Nutritional status Physical activity Advanced directives List of other physicians Hospitalizations, surgeries, and ER visits in previous 12 months Vitals Screenings to include cognitive, depression, and falls Referrals and appointments  In addition, I have reviewed and discussed with patient certain preventive protocols, quality metrics, and best practice recommendations. A written personalized care plan for preventive services as well as general preventive health recommendations were provided to patient.     Kellie Simmering, LPN   12/25/6438   Nurse Notes: none

## 2021-11-29 NOTE — Patient Instructions (Signed)
[name] , Thank you for taking time to come for your Medicare Wellness Visit. I appreciate your ongoing commitment to your health goals. Please review the following plan we discussed and let me know if I can assist you in the future.   Screening recommendations/referrals: Colonoscopy: completed 11/17/2019, due 11/16/2024 Mammogram: completed 10/24/2021, due 10/25/2022 Bone Density: completed 11/25/2017 Recommended yearly ophthalmology/optometry visit for glaucoma screening and checkup Recommended yearly dental visit for hygiene and checkup  Vaccinations: Influenza vaccine: completed 07/04/2021, due next flu season Pneumococcal vaccine: today Tdap vaccine: completed 10/21/2012, due 10/21/2022 Shingles vaccine: completed   Covid-19: 07/04/2021, 02/09/2021, 07/16/2020, 12/03/2019, 11/11/2019  Advanced directives: Advance directive discussed with you today. Even though you declined this today please call our office should you change your mind and we can give you the proper paperwork for you to fill out.  Conditions/risks identified: none  Next appointment: Follow up in one year for your annual wellness visit    Preventive Care 74 Years and Older, Female Preventive care refers to lifestyle choices and visits with your health care provider that can promote health and wellness. What does preventive care include? A yearly physical exam. This is also called an annual well check. Dental exams once or twice a year. Routine eye exams. Ask your health care provider how often you should have your eyes checked. Personal lifestyle choices, including: Daily care of your teeth and gums. Regular physical activity. Eating a healthy diet. Avoiding tobacco and drug use. Limiting alcohol use. Practicing safe sex. Taking low-dose aspirin every day. Taking vitamin and mineral supplements as recommended by your health care provider. What happens during an annual well check? The services and screenings done by your  health care provider during your annual well check will depend on your age, overall health, lifestyle risk factors, and family history of disease. Counseling  Your health care provider may ask you questions about your: Alcohol use. Tobacco use. Drug use. Emotional well-being. Home and relationship well-being. Sexual activity. Eating habits. History of falls. Memory and ability to understand (cognition). Work and work Statistician. Reproductive health. Screening  You may have the following tests or measurements: Height, weight, and BMI. Blood pressure. Lipid and cholesterol levels. These may be checked every 5 years, or more frequently if you are over 49 years old. Skin check. Lung cancer screening. You may have this screening every year starting at age 29 if you have a 30-pack-year history of smoking and currently smoke or have quit within the past 15 years. Fecal occult blood test (FOBT) of the stool. You may have this test every year starting at age 44. Flexible sigmoidoscopy or colonoscopy. You may have a sigmoidoscopy every 5 years or a colonoscopy every 10 years starting at age 48. Hepatitis C blood test. Hepatitis B blood test. Sexually transmitted disease (STD) testing. Diabetes screening. This is done by checking your blood sugar (glucose) after you have not eaten for a while (fasting). You may have this done every 1-3 years. Bone density scan. This is done to screen for osteoporosis. You may have this done starting at age 62. Mammogram. This may be done every 1-2 years. Talk to your health care provider about how often you should have regular mammograms. Talk with your health care provider about your test results, treatment options, and if necessary, the need for more tests. Vaccines  Your health care provider may recommend certain vaccines, such as: Influenza vaccine. This is recommended every year. Tetanus, diphtheria, and acellular pertussis (Tdap, Td) vaccine.  You may  need a Td booster every 10 years. Zoster vaccine. You may need this after age 58. Pneumococcal 13-valent conjugate (PCV13) vaccine. One dose is recommended after age 62. Pneumococcal polysaccharide (PPSV23) vaccine. One dose is recommended after age 40. Talk to your health care provider about which screenings and vaccines you need and how often you need them. This information is not intended to replace advice given to you by your health care provider. Make sure you discuss any questions you have with your health care provider. Document Released: 11/03/2015 Document Revised: 06/26/2016 Document Reviewed: 08/08/2015 Elsevier Interactive Patient Education  2017 [employer] Prevention in the Home Falls can cause injuries. They can happen to people of all ages. There are many things you can do to make your home safe and to help prevent falls. What can I do on the outside of my home? Regularly fix the edges of walkways and driveways and fix any cracks. Remove anything that might make you trip as you walk through a door, such as a raised step or threshold. Trim any bushes or trees on the path to your home. Use bright outdoor lighting. Clear any walking paths of anything that might make someone trip, such as rocks or tools. Regularly check to see if handrails are loose or broken. Make sure that both sides of any steps have handrails. Any raised decks and porches should have guardrails on the edges. Have any leaves, snow, or ice cleared regularly. Use sand or salt on walking paths during winter. Clean up any spills in your garage right away. This includes oil or grease spills. What can I do in the bathroom? Use night lights. Install grab bars by the toilet and in the tub and shower. Do not use towel bars as grab bars. Use non-skid mats or decals in the tub or shower. If you need to sit down in the shower, use a plastic, non-slip stool. Keep the floor dry. Clean up any water that spills on  the floor as soon as it happens. Remove soap buildup in the tub or shower regularly. Attach bath mats securely with double-sided non-slip rug tape. Do not have throw rugs and other things on the floor that can make you trip. What can I do in the bedroom? Use night lights. Make sure that you have a light by your bed that is easy to reach. Do not use any sheets or blankets that are too big for your bed. They should not hang down onto the floor. Have a firm chair that has side arms. You can use this for support while you get dressed. Do not have throw rugs and other things on the floor that can make you trip. What can I do in the kitchen? Clean up any spills right away. Avoid walking on wet floors. Keep items that you use a lot in easy-to-reach places. If you need to reach something above you, use a strong step stool that has a grab bar. Keep electrical cords out of the way. Do not use floor polish or wax that makes floors slippery. If you must use wax, use non-skid floor wax. Do not have throw rugs and other things on the floor that can make you trip. What can I do with my stairs? Do not leave any items on the stairs. Make sure that there are handrails on both sides of the stairs and use them. Fix handrails that are broken or loose. Make sure that handrails are as long as  the stairways. Check any carpeting to make sure that it is firmly attached to the stairs. Fix any carpet that is loose or worn. Avoid having throw rugs at the top or bottom of the stairs. If you do have throw rugs, attach them to the floor with carpet tape. Make sure that you have a light switch at the top of the stairs and the bottom of the stairs. If you do not have them, ask someone to add them for you. What else can I do to help prevent falls? Wear shoes that: Do not have high heels. Have rubber bottoms. Are comfortable and fit you well. Are closed at the toe. Do not wear sandals. If you use a stepladder: Make sure  that it is fully opened. Do not climb a closed stepladder. Make sure that both sides of the stepladder are locked into place. Ask someone to hold it for you, if possible. Clearly [place] and make sure that you can see: Any grab bars or handrails. First and last steps. Where the edge of each step is. Use tools that help you move around (mobility aids) if they are needed. These include: Canes. Walkers. Scooters. Crutches. Turn on the lights when you go into a dark area. Replace any light bulbs as soon as they burn out. Set up your furniture so you have a clear path. Avoid moving your furniture around. If any of your floors are uneven, fix them. If there are any pets around you, be aware of where they are. Review your medicines with your doctor. Some medicines can make you feel dizzy. This can increase your chance of falling. Ask your doctor what other things that you can do to help prevent falls. This information is not intended to replace advice given to you by your health care provider. Make sure you discuss any questions you have with your health care provider. Document Released: 08/03/2009 Document Revised: 03/14/2016 Document Reviewed: 11/11/2014 Elsevier Interactive Patient Education  2017 [employer].

## 2021-11-29 NOTE — Progress Notes (Signed)
I,Tianna Badgett,acting as a Education administrator for [employer], FNP.,have documented all relevant documentation on the behalf of [name], FNP,as directed by  [name], FNP while in the presence of [name], [place].  This visit occurred during the SARS-CoV-2 public health emergency.  Safety protocols were in place, including screening questions prior to the visit, additional usage of staff PPE, and extensive cleaning of exam room while observing appropriate contact time as indicated for disinfecting solutions.  Subjective:     Patient ID: Dawn Wang , female    DOB: 1946/06/13 , 76 y.o.   MRN: [id number]   Chief Complaint  Patient presents with   Hyperlipidemia    HPI  Patient presents today for a f/u on abnormal glucose and elevated LDL. She also had her AWV.  She ran to get her phone prior to having her blood pressure checked.  She took the prediabetes class in August. 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 She was doing water aerobics until December.   Wt Readings from Last 3 Encounters: 11/29/21 : 148 lb (67.1 kg) 11/29/21 : 148 lb 6.4 oz (67.3 kg) 05/01/21 : 144 lb 9.6 oz (65.6 kg)    Hyperlipidemia This is a chronic problem. The current episode started more than 1 year ago. The problem is controlled. She has no history of chronic renal disease. There are no known factors aggravating her hyperlipidemia. Pertinent negatives include no chest pain. There are no compliance problems.   Diabetes She presents for her follow-up diabetic visit. Diabetes type: prediabetes. Her disease course has been stable. There are no hypoglycemic associated symptoms. Pertinent negatives for hypoglycemia include no dizziness or headaches. There are no diabetic associated symptoms. Pertinent negatives for diabetes include no chest pain. There are no hypoglycemic complications. There are no diabetic complications. Risk factors for coronary artery disease include sedentary lifestyle. Compliance with  diabetes treatment: no medications. When asked about meal planning, she reported none. She has not had a previous visit with a dietitian. She rarely participates in exercise. She does not see a podiatrist.Eye exam is not current.    History reviewed. No pertinent past medical history.   Family History  Problem Relation Age of Onset   Cancer Mother    Diabetes Father    Hypertension Father     No current outpatient medications on file.   No Known Allergies   Review of Systems  Constitutional: Negative.   Respiratory: Negative.    Cardiovascular: Negative.  Negative for chest pain.  Gastrointestinal: Negative.   Neurological: Negative.  Negative for dizziness and headaches.    Today's Vitals   11/29/21 0951  BP: (!) 148/70  Pulse: 66  Temp: 98.4 F (36.9 C)  TempSrc: Oral  Weight: 148 lb (67.1 kg)  Height: 5' 0.6" (1.539 m)   Body mass index is 28.33 kg/m.   Objective:  Physical Exam Vitals reviewed.  Constitutional:      General: She is not in acute distress.    Appearance: Normal appearance.  Cardiovascular:     Rate and Rhythm: Normal rate and regular rhythm.     Pulses: Normal pulses.     Heart sounds: Normal heart sounds. No murmur heard. Pulmonary:     Effort: Pulmonary effort is normal. No respiratory distress.     Breath sounds: Normal breath sounds. No wheezing.  Skin:    Capillary Refill: Capillary refill takes less than 2 seconds.     Comments: 3m darkened area to left hand lateral.  Neurological:     General: No focal deficit present.     Mental Status: She is alert and oriented to person, place, and time.     Cranial Nerves: No cranial nerve deficit.     Motor: No weakness.  Psychiatric:        Mood and Affect: Mood normal.        Behavior: Behavior normal.        Thought Content: Thought content normal.        Judgment: Judgment normal.        Assessment And Plan:     1. Mixed hyperlipidemia Comments: Stable, continue limiting fried and  fatty foods. - Lipid panel  2. Prediabetes Comments: No current medications, continue healthy diet and regular exercise - BMP8+EGFR - Hemoglobin A1c     Patient was given opportunity to ask questions. Patient verbalized understanding of the plan and was able to repeat key elements of the plan. All questions were answered to their satisfaction.  [name], FNP   I, [name], FNP, have reviewed all documentation for this visit. The documentation on 11/29/21 for the exam, diagnosis, procedures, and orders are all accurate and complete.   IF YOU HAVE BEEN REFERRED TO A SPECIALIST, IT MAY TAKE 1-2 WEEKS TO SCHEDULE/PROCESS THE REFERRAL. IF YOU HAVE NOT HEARD FROM US/SPECIALIST IN TWO WEEKS, PLEASE GIVE [place] A CALL AT [phone].   THE PATIENT IS ENCOURAGED TO PRACTICE SOCIAL DISTANCING DUE TO THE COVID-19 PANDEMIC.

## 2021-11-30 LAB — BMP8+EGFR
BUN/Creatinine Ratio: 13 (ref 12–28)
BUN: 12 mg/dL (ref 8–27)
CO2: 24 mmol/L (ref 20–29)
Calcium: 9.1 mg/dL (ref 8.7–10.3)
Chloride: 108 mmol/L — ABNORMAL HIGH (ref 96–106)
Creatinine, Ser: 0.89 mg/dL (ref 0.57–1.00)
Glucose: 80 mg/dL (ref 70–99)
Potassium: 5.2 mmol/L (ref 3.5–5.2)
Sodium: 145 mmol/L — ABNORMAL HIGH (ref 134–144)
eGFR: 68 mL/min/{1.73_m2} (ref >59)

## 2021-11-30 LAB — LIPID PANEL
Chol/HDL Ratio: 3.3 ratio (ref 0.0–4.4)
Cholesterol, Total: 175 mg/dL (ref 100–199)
HDL: 53 mg/dL (ref >39)
LDL Chol Calc (NIH): 108 mg/dL — ABNORMAL HIGH (ref 0–99)
Triglycerides: 77 mg/dL (ref 0–149)
VLDL Cholesterol Cal: 14 mg/dL (ref 5–40)

## 2021-11-30 LAB — HEMOGLOBIN A1C
Est. average glucose Bld gHb Est-mCnc: 120 mg/dL
Hgb A1c MFr Bld: 5.8 % — ABNORMAL HIGH (ref 4.8–5.6)

## 2022-05-02 ENCOUNTER — Ambulatory Visit (INDEPENDENT_AMBULATORY_CARE_PROVIDER_SITE_OTHER): Payer: Medicare PPO | Admitting: Nurse Practitioner

## 2022-05-02 ENCOUNTER — Encounter: Payer: Self-pay | Admitting: Nurse Practitioner

## 2022-05-02 VITALS — BP 120/60 | HR 57 | Temp 98.5°F | Ht 60.6 in | Wt 146.8 lb

## 2022-05-02 DIAGNOSIS — R7303 Prediabetes: Secondary | ICD-10-CM | POA: Diagnosis not present

## 2022-05-02 DIAGNOSIS — E782 Mixed hyperlipidemia: Secondary | ICD-10-CM

## 2022-05-02 DIAGNOSIS — Z0000 Encounter for general adult medical examination without abnormal findings: Secondary | ICD-10-CM

## 2022-05-02 DIAGNOSIS — Z6828 Body mass index (BMI) 28.0-28.9, adult: Secondary | ICD-10-CM | POA: Diagnosis not present

## 2022-05-02 DIAGNOSIS — R252 Cramp and spasm: Secondary | ICD-10-CM | POA: Diagnosis not present

## 2022-05-02 NOTE — Patient Instructions (Signed)

## 2022-05-02 NOTE — Progress Notes (Signed)
I,Dawn Wang,acting as a Education administrator for [employer], FNP.,have documented all relevant documentation on the behalf of Dawn Brine, FNP,as directed by  Dawn Brine, FNP while in the presence of Dawn Wang, [place].   This visit occurred during the SARS-CoV-2 public health emergency.  Safety protocols were in place, including screening questions prior to the visit, additional usage of staff PPE, and extensive cleaning of exam room while observing appropriate contact time as indicated for disinfecting solutions.  Subjective:     Patient ID: Dawn Wang , female    DOB: Feb 27, 1946 , 76 y.o.   MRN: [id number]   Chief Complaint  Patient presents with   Annual Exam    HPI  Here for HM. After her hysterectomy she had some discomfort. Patient complains of left leg pain. She reports her thigh cramps making it difficult for her to walk. She can work out in the yard and exercise and not have any pain but if she is sitting or standing for long periods has pain to her left thigh. Pain last for short period of time.   Wt Readings from Last 3 Encounters: 05/02/22 : 146 lb 12.8 oz (66.6 kg) 11/29/21 : 148 lb (67.1 kg) 11/29/21 : 148 lb 6.4 oz (67.3 kg)     Diabetes She presents for her follow-up diabetic visit. Diabetes type: prediabetes. There are no hypoglycemic associated symptoms. There are no diabetic associated symptoms. There are no hypoglycemic complications. Risk factors for coronary artery disease include sedentary lifestyle. When asked about current treatments, none were reported.     History reviewed. No pertinent past medical history.   Family History  Problem Relation Age of Onset   Cancer Mother    Diabetes Father    Hypertension Father     No current outpatient medications on file.   No Known Allergies    The patient states she is status post hysterectomy.  Last LMP was No LMP recorded. Patient has had a hysterectomy.. Negative for Dysmenorrhea and Negative for  Menorrhagia. Negative for: breast discharge, breast lump(s), breast pain and breast self exam. Associated symptoms include abnormal vaginal bleeding. Pertinent negatives include abnormal bleeding (hematology), anxiety, decreased libido, depression, difficulty falling sleep, dyspareunia, history of infertility, nocturia, sexual dysfunction, sleep disturbances, urinary incontinence, urinary urgency, vaginal discharge and vaginal itching. Diet regular, 6 glasses of water. The patient states her exercise level is minimal - 2 days a week.   The patient's tobacco use is:  Social History   Tobacco Use  Smoking Status Never   Passive exposure: Past  Smokeless Tobacco Never   She has been exposed to passive smoke. The patient's alcohol use is:  Social History   Substance and Sexual Activity  Alcohol Use Not Currently     Review of Systems  Constitutional: Negative.   HENT: Negative.    Eyes: Negative.   Respiratory: Negative.    Cardiovascular: Negative.   Gastrointestinal: Negative.   Endocrine: Negative.   Genitourinary: Negative.   Musculoskeletal:  Positive for myalgias.  Skin: Negative.   Allergic/Immunologic: Negative.   Neurological: Negative.   Hematological: Negative.   Psychiatric/Behavioral: Negative.       Today's Vitals   05/02/22 0931  BP: 120/60  Pulse: (!) 57  Temp: 98.5 F (36.9 C)  Weight: 146 lb 12.8 oz (66.6 kg)  Height: 5' 0.6" (1.539 m)   Body mass index is 28.1 kg/m.   Objective:  Physical Exam Vitals reviewed.  Constitutional:      General: She  is not in acute distress.    Appearance: Normal appearance. She is well-developed.  HENT:     Head: Normocephalic and atraumatic.     Right Ear: Hearing, tympanic membrane, ear canal and external ear normal. There is no impacted cerumen.     Left Ear: Hearing, tympanic membrane, ear canal and external ear normal. There is no impacted cerumen.     Nose: Nose normal.     Mouth/Throat:     Mouth: Mucous  membranes are moist.  Eyes:     General: Lids are normal.     Extraocular Movements: Extraocular movements intact.     Conjunctiva/sclera: Conjunctivae normal.     Pupils: Pupils are equal, round, and reactive to light.     Funduscopic exam:    Right eye: No papilledema.        Left eye: No papilledema.  Neck:     Thyroid: No thyroid mass.     Vascular: No carotid bruit.  Cardiovascular:     Rate and Rhythm: Normal rate and regular rhythm.     Pulses: Normal pulses.     Heart sounds: Normal heart sounds. No murmur heard. Pulmonary:     Effort: Pulmonary effort is normal. No respiratory distress.     Breath sounds: Normal breath sounds. No wheezing.  Chest:  Breasts:    Right: Normal. No mass or tenderness.     Left: Normal. No mass or tenderness.  Abdominal:     General: Abdomen is flat. Bowel sounds are normal. There is no distension.     Palpations: Abdomen is soft.     Tenderness: There is no abdominal tenderness.  Musculoskeletal:        General: No swelling or tenderness. Normal range of motion.     Cervical back: Full passive range of motion without pain, normal range of motion and neck supple.     Right lower leg: No edema.     Left lower leg: No edema.     Comments: Negative straight leg raise or pain to lower extremities on palpation  Skin:    General: Skin is warm and dry.     Capillary Refill: Capillary refill takes less than 2 seconds.  Neurological:     General: No focal deficit present.     Mental Status: She is alert and oriented to person, place, and time.     Cranial Nerves: No cranial nerve deficit.     Sensory: No sensory deficit.     Motor: No weakness.  Psychiatric:        Mood and Affect: Mood normal.        Behavior: Behavior normal.        Thought Content: Thought content normal.        Judgment: Judgment normal.         Assessment And Plan:     1. Encounter for general adult medical examination w/o abnormal findings - CBC -  CMP14+EGFR  2. Mixed hyperlipidemia Comments: Diet controlled, continue focusing on healthy diet low in fat.  - Lipid panel  3. Prediabetes Comments: Diet controlled, she had done well with heer diabetes class.  - Hemoglobin A1c  4. BMI 28.0-28.9,adult Comments: Continue regular exercise and healthy diet.   5. Leg cramping Comments: Left leg cramping intermittently, will check Magnesium. No abnormal findings on physical exam - Magnesium   Patient was given opportunity to ask questions. Patient verbalized understanding of the plan and was able to repeat key elements of the  plan. All questions were answered to their satisfaction.   Dawn Brine, FNP   I, Dawn Brine, FNP, have reviewed all documentation for this visit. The documentation on 05/03/22 for the exam, diagnosis, procedures, and orders are all accurate and complete.  THE PATIENT IS ENCOURAGED TO PRACTICE SOCIAL DISTANCING DUE TO THE COVID-19 PANDEMIC.

## 2022-05-03 LAB — CMP14+EGFR
ALT: 12 IU/L (ref 0–32)
AST: 17 IU/L (ref 0–40)
Albumin/Globulin Ratio: 1.5 (ref 1.2–2.2)
Albumin: 4 g/dL (ref 3.8–4.8)
Alkaline Phosphatase: 81 IU/L (ref 44–121)
BUN/Creatinine Ratio: 16 (ref 12–28)
BUN: 13 mg/dL (ref 8–27)
Bilirubin Total: 0.3 mg/dL (ref 0.0–1.2)
CO2: 26 mmol/L (ref 20–29)
Calcium: 9.1 mg/dL (ref 8.7–10.3)
Chloride: 107 mmol/L — ABNORMAL HIGH (ref 96–106)
Creatinine, Ser: 0.83 mg/dL (ref 0.57–1.00)
Globulin, Total: 2.6 g/dL (ref 1.5–4.5)
Glucose: 99 mg/dL (ref 70–99)
Potassium: 4.7 mmol/L (ref 3.5–5.2)
Sodium: 144 mmol/L (ref 134–144)
Total Protein: 6.6 g/dL (ref 6.0–8.5)
eGFR: 74 mL/min/{1.73_m2} (ref >59)

## 2022-05-03 LAB — CBC
Hematocrit: 37.8 % (ref 34.0–46.6)
Hemoglobin: 12.6 g/dL (ref 11.1–15.9)
MCH: 30.4 pg (ref 26.6–33.0)
MCHC: 33.3 g/dL (ref 31.5–35.7)
MCV: 91 fL (ref 79–97)
Platelets: 188 10*3/uL (ref 150–450)
RBC: 4.15 x10E6/uL (ref 3.77–5.28)
RDW: 12.5 % (ref 11.7–15.4)
WBC: 4.6 10*3/uL (ref 3.4–10.8)

## 2022-05-03 LAB — LIPID PANEL
Chol/HDL Ratio: 3.6 ratio (ref 0.0–4.4)
Cholesterol, Total: 182 mg/dL (ref 100–199)
HDL: 51 mg/dL (ref >39)
LDL Chol Calc (NIH): 110 mg/dL — ABNORMAL HIGH (ref 0–99)
Triglycerides: 117 mg/dL (ref 0–149)
VLDL Cholesterol Cal: 21 mg/dL (ref 5–40)

## 2022-05-03 LAB — HEMOGLOBIN A1C
Est. average glucose Bld gHb Est-mCnc: 117 mg/dL
Hgb A1c MFr Bld: 5.7 % — ABNORMAL HIGH (ref 4.8–5.6)

## 2022-05-03 LAB — MAGNESIUM: Magnesium: 2.1 mg/dL (ref 1.6–2.3)

## 2022-05-28 DIAGNOSIS — J40 Bronchitis, not specified as acute or chronic: Secondary | ICD-10-CM | POA: Diagnosis not present

## 2022-05-28 DIAGNOSIS — J040 Acute laryngitis: Secondary | ICD-10-CM | POA: Diagnosis not present

## 2022-06-03 ENCOUNTER — Encounter: Payer: Self-pay | Admitting: Nurse Practitioner

## 2022-06-03 ENCOUNTER — Ambulatory Visit: Payer: Medicare PPO | Admitting: Nurse Practitioner

## 2022-06-03 VITALS — BP 138/68 | HR 64 | Temp 98.2°F | Ht 60.6 in | Wt 144.8 lb

## 2022-06-03 DIAGNOSIS — J40 Bronchitis, not specified as acute or chronic: Secondary | ICD-10-CM

## 2022-06-03 DIAGNOSIS — H6690 Otitis media, unspecified, unspecified ear: Secondary | ICD-10-CM | POA: Diagnosis not present

## 2022-06-03 NOTE — Progress Notes (Signed)
I,Tianna Badgett,acting as a Education administrator for [employer], FNP.,have documented all relevant documentation on the behalf of [name], FNP,as directed by  [name], FNP while in the presence of [name], [place].  Subjective:     Patient ID: Dawn Wang , female    DOB: 23-Jan-1946 , 76 y.o.   MRN: [id number]   Chief Complaint  Patient presents with   Follow-up    HPI  Patient presents today for urgent care follow up. Seen on 05/28/22 at an Urgent Care for cough and congestion, laryngitis, diagnosed with bronchitis and ear infection left ear. She was treated with antibiotic, prednisone and cough syrup. She is feeling better and her voice is coming back. Wanted to come in to get checked. She is on the last 4 days of her doxycycline     History reviewed. No pertinent past medical history.   Family History  Problem Relation Age of Onset   Cancer Mother    Diabetes Father    Hypertension Father      Current Outpatient Medications:    doxycycline (MONODOX) 100 MG capsule, Take 100 mg by mouth 2 (two) times daily., Disp: , Rfl:    predniSONE (DELTASONE) 20 MG tablet, Take 20 mg by mouth 2 (two) times daily., Disp: , Rfl:    promethazine-dextromethorphan (PROMETHAZINE-DM) 6.25-15 MG/5ML syrup, Take 5 mLs by mouth 4 (four) times daily as needed., Disp: , Rfl:    No Known Allergies   Review of Systems  Constitutional: Negative.   Respiratory:  Positive for cough. Negative for shortness of breath and wheezing.   Cardiovascular: Negative.   Gastrointestinal: Negative.   Neurological: Negative.   Psychiatric/Behavioral: Negative.       Today's Vitals   06/03/22 0943  BP: 138/68  Pulse: 64  Temp: 98.2 F (36.8 C)  TempSrc: Oral  Weight: 144 lb 12.8 oz (65.7 kg)  Height: 5' 0.6" (1.539 m)   Body mass index is 27.72 kg/m.   Objective:  Physical Exam Vitals reviewed.  Constitutional:      General: She is not in acute distress.    Appearance: Normal appearance.   Cardiovascular:     Rate and Rhythm: Normal rate and regular rhythm.     Pulses: Normal pulses.     Heart sounds: Normal heart sounds. No murmur heard. Pulmonary:     Effort: Pulmonary effort is normal. No respiratory distress.     Breath sounds: Normal breath sounds. No wheezing.  Skin:    Capillary Refill: Capillary refill takes less than 2 seconds.  Neurological:     General: No focal deficit present.     Mental Status: She is alert and oriented to person, place, and time.     Cranial Nerves: No cranial nerve deficit.     Motor: No weakness.  Psychiatric:        Mood and Affect: Mood normal.        Behavior: Behavior normal.        Thought Content: Thought content normal.        Judgment: Judgment normal.         Assessment And Plan:     1. Bronchitis Comments: Doing better, no abnormal breath sounds. Has completed her prednisone  2. Acute otitis media, unspecified otitis media type Comments: Left ear, no concerns for ear infection at time. Advised to take Doxycycline until completely gone    Patient was given opportunity to ask questions. Patient verbalized understanding of the plan and was able  to repeat key elements of the plan. All questions were answered to their satisfaction.  [name], FNP   I, [name], FNP, have reviewed all documentation for this visit. The documentation on 06/03/22 for the exam, diagnosis, procedures, and orders are all accurate and complete.   IF YOU HAVE BEEN REFERRED TO A SPECIALIST, IT MAY TAKE 1-2 WEEKS TO SCHEDULE/PROCESS THE REFERRAL. IF YOU HAVE NOT HEARD FROM US/SPECIALIST IN TWO WEEKS, PLEASE GIVE [place] A CALL AT [phone].   THE PATIENT IS ENCOURAGED TO PRACTICE SOCIAL DISTANCING DUE TO THE COVID-19 PANDEMIC.

## 2022-06-18 ENCOUNTER — Ambulatory Visit: Payer: Medicare PPO | Admitting: Nurse Practitioner

## 2022-07-11 ENCOUNTER — Encounter: Payer: Self-pay | Admitting: Nurse Practitioner

## 2022-09-10 ENCOUNTER — Other Ambulatory Visit: Payer: Self-pay | Admitting: Internal Medicine

## 2022-09-10 DIAGNOSIS — Z1231 Encounter for screening mammogram for malignant neoplasm of breast: Secondary | ICD-10-CM

## 2022-11-08 ENCOUNTER — Ambulatory Visit
Admission: RE | Admit: 2022-11-08 | Discharge: 2022-11-08 | Disposition: A | Discharge status: Home / Self Care | Payer: Medicare PPO | Source: Ambulatory Visit | Attending: Internal Medicine | Admitting: Internal Medicine

## 2022-11-08 DIAGNOSIS — Z1231 Encounter for screening mammogram for malignant neoplasm of breast: Secondary | ICD-10-CM

## 2022-11-27 DIAGNOSIS — K5900 Constipation, unspecified: Secondary | ICD-10-CM | POA: Insufficient documentation

## 2022-11-27 DIAGNOSIS — Z800 Family history of malignant neoplasm of digestive organs: Secondary | ICD-10-CM | POA: Insufficient documentation

## 2022-11-28 ENCOUNTER — Ambulatory Visit: Payer: Medicare PPO | Admitting: Nurse Practitioner

## 2022-11-28 ENCOUNTER — Encounter: Payer: Self-pay | Admitting: Nurse Practitioner

## 2022-11-28 VITALS — BP 126/80 | HR 65 | Temp 98.1°F | Ht 60.0 in | Wt 142.0 lb

## 2022-11-28 DIAGNOSIS — E782 Mixed hyperlipidemia: Secondary | ICD-10-CM

## 2022-11-28 DIAGNOSIS — R7303 Prediabetes: Secondary | ICD-10-CM | POA: Diagnosis not present

## 2022-11-28 DIAGNOSIS — Z6827 Body mass index (BMI) 27.0-27.9, adult: Secondary | ICD-10-CM | POA: Diagnosis not present

## 2022-11-28 DIAGNOSIS — Z23 Encounter for immunization: Secondary | ICD-10-CM

## 2022-11-28 DIAGNOSIS — M25551 Pain in right hip: Secondary | ICD-10-CM | POA: Diagnosis not present

## 2022-11-28 NOTE — Progress Notes (Signed)
I,Sheena H Holbrook,acting as a Education administrator for [name], FNP.,have documented all relevant documentation on the behalf of [name], FNP,as directed by  [name], FNP while in the presence of [name], [place].    Subjective:     Patient ID: Dawn Wang , female    DOB: March 23, 1946 , 77 y.o.   MRN: [id number]   Chief Complaint  Patient presents with   Follow-up    HLD   Hip Pain    Right, thinks strained muscle while bowling several weeks ago    HPI  Patient presents today for hyperlipidemia follow up. Patient complains of right hip/thigh pain, thinks she strained a muscle while bowling several weeks ago. She felt the discomfort when she stretched. The next morning she was unable to get up. She has been using an arthritis cream on her right hip. She has been taking tylenol before bed. This has been ongoing for one month, some improvement  The 10-year ASCVD risk score (Arnett DK, et al., 2019) is: 13.7%   Values used to calculate the score:     Age: 57 years     Sex: Female     Is Non-Hispanic African American: Yes     Diabetic: No     Tobacco smoker: No     Systolic Blood Pressure: 109 mmHg     Is BP treated: No     HDL Cholesterol: 51 mg/dL     Total Cholesterol: 182 mg/dL  Wt Readings from Last 3 Encounters: 11/28/22 : 142 lb (64.4 kg) 06/03/22 : 144 lb 12.8 oz (65.7 kg) 05/02/22 : 146 lb 12.8 oz (66.6 kg)         History reviewed. No pertinent past medical history.   Family History  Problem Relation Age of Onset   Cancer Mother    Diabetes Father    Hypertension Father     No current outpatient medications on file.   No Known Allergies   Review of Systems  Constitutional: Negative.   Respiratory: Negative.    Cardiovascular: Negative.   Musculoskeletal:  Positive for arthralgias and myalgias.  Neurological: Negative.   Psychiatric/Behavioral: Negative.    All other systems reviewed and are negative.    Today's Vitals   11/28/22 1414  BP:  126/80  Pulse: 65  Temp: 98.1 F (36.7 C)  TempSrc: Oral  SpO2: 98%  Weight: 142 lb (64.4 kg)  Height: 5' (1.524 m)  PainSc: 2   PainLoc: Hip   Body mass index is 27.73 kg/m.   Objective:  Physical Exam Vitals reviewed.  Constitutional:      General: She is not in acute distress.    Appearance: Normal appearance.  Cardiovascular:     Rate and Rhythm: Normal rate and regular rhythm.     Pulses: Normal pulses.     Heart sounds: Normal heart sounds. No murmur heard. Pulmonary:     Effort: Pulmonary effort is normal. No respiratory distress.     Breath sounds: Normal breath sounds. No wheezing.  Musculoskeletal:        General: Tenderness and signs of injury present. No swelling or deformity.     Comments: Right hip pain  Skin:    General: Skin is warm.     Capillary Refill: Capillary refill takes less than 2 seconds.  Neurological:     General: No focal deficit present.     Mental Status: She is alert and oriented to person, place, and time.     Cranial  Nerves: No cranial nerve deficit.     Motor: No weakness.  Psychiatric:        Mood and Affect: Mood normal.        Behavior: Behavior normal.        Thought Content: Thought content normal.        Judgment: Judgment normal.         Assessment And Plan:     1. Mixed hyperlipidemia Comments: Cholesterol levels have been normal, her ASCVD risk score is 13.7%, will consider a calcium risk score pending cost. Discussed low fat diet - Lipid panel - BMP8+eGFR  2. Prediabetes Comments: HgbA1c is stable. Continue focusing on diet low in sugar and starches. - Hemoglobin A1c - BMP8+eGFR  3. Right hip pain Comments: No pain with movement, she is to take tylenol twice a day for 3-5 days if not better return call to office. Encouraged to stretch regularly as well.  4. BMI 27.0-27.9,adult Comments: She continues to do well with her weight loss.    Patient was given opportunity to ask questions. Patient verbalized  understanding of the plan and was able to repeat key elements of the plan. All questions were answered to their satisfaction.  [name], FNP   I, [name], FNP, have reviewed all documentation for this visit. The documentation on 11/28/22 for the exam, diagnosis, procedures, and orders are all accurate and complete.   IF YOU HAVE BEEN REFERRED TO A SPECIALIST, IT MAY TAKE 1-2 WEEKS TO SCHEDULE/PROCESS THE REFERRAL. IF YOU HAVE NOT HEARD FROM US/SPECIALIST IN TWO WEEKS, PLEASE GIVE [place] A CALL AT [phone].   THE PATIENT IS ENCOURAGED TO PRACTICE SOCIAL DISTANCING DUE TO THE COVID-19 PANDEMIC.

## 2022-11-28 NOTE — Addendum Note (Signed)
Addended by: Wadie Lessen on: 11/28/2022 03:19 PM   Modules accepted: Orders

## 2022-11-29 LAB — BMP8+EGFR
BUN/Creatinine Ratio: 16 (ref 12–28)
BUN: 13 mg/dL (ref 8–27)
CO2: 25 mmol/L (ref 20–29)
Calcium: 9.5 mg/dL (ref 8.7–10.3)
Chloride: 106 mmol/L (ref 96–106)
Creatinine, Ser: 0.81 mg/dL (ref 0.57–1.00)
Glucose: 94 mg/dL (ref 70–99)
Potassium: 4.6 mmol/L (ref 3.5–5.2)
Sodium: 143 mmol/L (ref 134–144)
eGFR: 76 mL/min/{1.73_m2} (ref >59)

## 2022-11-29 LAB — LIPID PANEL
Chol/HDL Ratio: 3.7 ratio (ref 0.0–4.4)
Cholesterol, Total: 183 mg/dL (ref 100–199)
HDL: 49 mg/dL (ref >39)
LDL Chol Calc (NIH): 116 mg/dL — ABNORMAL HIGH (ref 0–99)
Triglycerides: 101 mg/dL (ref 0–149)
VLDL Cholesterol Cal: 18 mg/dL (ref 5–40)

## 2022-11-29 LAB — HEMOGLOBIN A1C
Est. average glucose Bld gHb Est-mCnc: 126 mg/dL
Hgb A1c MFr Bld: 6 % — ABNORMAL HIGH (ref 4.8–5.6)

## 2022-12-02 ENCOUNTER — Ambulatory Visit: Payer: Medicare PPO | Admitting: Nurse Practitioner

## 2022-12-18 ENCOUNTER — Ambulatory Visit (INDEPENDENT_AMBULATORY_CARE_PROVIDER_SITE_OTHER): Payer: Medicare PPO

## 2022-12-18 ENCOUNTER — Ambulatory Visit: Payer: Medicare PPO | Admitting: Nurse Practitioner

## 2022-12-18 VITALS — Ht 61.0 in | Wt 140.0 lb

## 2022-12-18 DIAGNOSIS — Z0000 Encounter for general adult medical examination without abnormal findings: Secondary | ICD-10-CM

## 2022-12-18 NOTE — Patient Instructions (Signed)
[name] , Thank you for taking time to come for your Medicare Wellness Visit. I appreciate your ongoing commitment to your health goals. Please review the following plan we discussed and let me know if I can assist you in the future.   These are the goals we discussed:  Goals      Patient Stated     06/30/2019, to get colonoscopy     Patient Stated     11/22/2020, keep A1C down     Patient Stated     11/29/2021 maintain A1C     Patient Stated     12/18/2022, wants to keep weight down        This is a list of the screening recommended for you and due dates:  Health Maintenance  Topic Date Due   COVID-19 Vaccine (7 - 2023-24 season) 09/05/2022   Medicare Annual Wellness Visit  12/19/2023   Mammogram  11/08/2024   Colon Cancer Screening  11/16/2024   DTaP/Tdap/Td vaccine (2 - Td or Tdap) 11/28/2032   Pneumonia Vaccine  Completed   Flu Shot  Completed   DEXA scan (bone density measurement)  Completed   Hepatitis C Screening: USPSTF Recommendation to screen - Ages 74-79 yo.  Completed   Zoster (Shingles) Vaccine  Completed   HPV Vaccine  Aged Out    Advanced directives: Advance directive discussed with you today.   Conditions/risks identified: none  Next appointment: Follow up in one year for your annual wellness visit    Preventive Care 65 Years and Older, Female Preventive care refers to lifestyle choices and visits with your health care provider that can promote health and wellness. What does preventive care include? A yearly physical exam. This is also called an annual well check. Dental exams once or twice a year. Routine eye exams. Ask your health care provider how often you should have your eyes checked. Personal lifestyle choices, including: Daily care of your teeth and gums. Regular physical activity. Eating a healthy diet. Avoiding tobacco and drug use. Limiting alcohol use. Practicing safe sex. Taking low-dose aspirin every day. Taking vitamin and mineral  supplements as recommended by your health care provider. What happens during an annual well check? The services and screenings done by your health care provider during your annual well check will depend on your age, overall health, lifestyle risk factors, and family history of disease. Counseling  Your health care provider may ask you questions about your: Alcohol use. Tobacco use. Drug use. Emotional well-being. Home and relationship well-being. Sexual activity. Eating habits. History of falls. Memory and ability to understand (cognition). Work and work Statistician. Reproductive health. Screening  You may have the following tests or measurements: Height, weight, and BMI. Blood pressure. Lipid and cholesterol levels. These may be checked every 5 years, or more frequently if you are over 53 years old. Skin check. Lung cancer screening. You may have this screening every year starting at age 47 if you have a 30-pack-year history of smoking and currently smoke or have quit within the past 15 years. Fecal occult blood test (FOBT) of the stool. You may have this test every year starting at age 31. Flexible sigmoidoscopy or colonoscopy. You may have a sigmoidoscopy every 5 years or a colonoscopy every 10 years starting at age 58. Hepatitis C blood test. Hepatitis B blood test. Sexually transmitted disease (STD) testing. Diabetes screening. This is done by checking your blood sugar (glucose) after you have not eaten for a while (fasting). You may  have this done every 1-3 years. Bone density scan. This is done to screen for osteoporosis. You may have this done starting at age 37. Mammogram. This may be done every 1-2 years. Talk to your health care provider about how often you should have regular mammograms. Talk with your health care provider about your test results, treatment options, and if necessary, the need for more tests. Vaccines  Your health care provider may recommend certain  vaccines, such as: Influenza vaccine. This is recommended every year. Tetanus, diphtheria, and acellular pertussis (Tdap, Td) vaccine. You may need a Td booster every 10 years. Zoster vaccine. You may need this after age 56. Pneumococcal 13-valent conjugate (PCV13) vaccine. One dose is recommended after age 30. Pneumococcal polysaccharide (PPSV23) vaccine. One dose is recommended after age 66. Talk to your health care provider about which screenings and vaccines you need and how often you need them. This information is not intended to replace advice given to you by your health care provider. Make sure you discuss any questions you have with your health care provider. Document Released: 11/03/2015 Document Revised: 06/26/2016 Document Reviewed: 08/08/2015 Elsevier Interactive Patient Education  2017 [place] Prevention in the Home Falls can cause injuries. They can happen to people of all ages. There are many things you can do to make your home safe and to help prevent falls. What can I do on the outside of my home? Regularly fix the edges of walkways and driveways and fix any cracks. Remove anything that might make you trip as you walk through a door, such as a raised step or threshold. Trim any bushes or trees on the path to your home. Use bright outdoor lighting. Clear any walking paths of anything that might make someone trip, such as rocks or tools. Regularly check to see if handrails are loose or broken. Make sure that both sides of any steps have handrails. Any raised decks and porches should have guardrails on the edges. Have any leaves, snow, or ice cleared regularly. Use sand or salt on walking paths during winter. Clean up any spills in your garage right away. This includes oil or grease spills. What can I do in the bathroom? Use night lights. Install grab bars by the toilet and in the tub and shower. Do not use towel bars as grab bars. Use non-skid mats or decals in  the tub or shower. If you need to sit down in the shower, use a plastic, non-slip stool. Keep the floor dry. Clean up any water that spills on the floor as soon as it happens. Remove soap buildup in the tub or shower regularly. Attach bath mats securely with double-sided non-slip rug tape. Do not have throw rugs and other things on the floor that can make you trip. What can I do in the bedroom? Use night lights. Make sure that you have a light by your bed that is easy to reach. Do not use any sheets or blankets that are too big for your bed. They should not hang down onto the floor. Have a firm chair that has side arms. You can use this for support while you get dressed. Do not have throw rugs and other things on the floor that can make you trip. What can I do in the kitchen? Clean up any spills right away. Avoid walking on wet floors. Keep items that you use a lot in easy-to-reach places. If you need to reach something above you, use a strong step  stool that has a grab bar. Keep electrical cords out of the way. Do not use floor polish or wax that makes floors slippery. If you must use wax, use non-skid floor wax. Do not have throw rugs and other things on the floor that can make you trip. What can I do with my stairs? Do not leave any items on the stairs. Make sure that there are handrails on both sides of the stairs and use them. Fix handrails that are broken or loose. Make sure that handrails are as long as the stairways. Check any carpeting to make sure that it is firmly attached to the stairs. Fix any carpet that is loose or worn. Avoid having throw rugs at the top or bottom of the stairs. If you do have throw rugs, attach them to the floor with carpet tape. Make sure that you have a light switch at the top of the stairs and the bottom of the stairs. If you do not have them, ask someone to add them for you. What else can I do to help prevent falls? Wear shoes that: Do not have high  heels. Have rubber bottoms. Are comfortable and fit you well. Are closed at the toe. Do not wear sandals. If you use a stepladder: Make sure that it is fully opened. Do not climb a closed stepladder. Make sure that both sides of the stepladder are locked into place. Ask someone to hold it for you, if possible. Clearly Fels and make sure that you can see: Any grab bars or handrails. First and last steps. Where the edge of each step is. Use tools that help you move around (mobility aids) if they are needed. These include: Canes. Walkers. Scooters. Crutches. Turn on the lights when you go into a dark area. Replace any light bulbs as soon as they burn out. Set up your furniture so you have a clear path. Avoid moving your furniture around. If any of your floors are uneven, fix them. If there are any pets around you, be aware of where they are. Review your medicines with your doctor. Some medicines can make you feel dizzy. This can increase your chance of falling. Ask your doctor what other things that you can do to help prevent falls. This information is not intended to replace advice given to you by your health care provider. Make sure you discuss any questions you have with your health care provider. Document Released: 08/03/2009 Document Revised: 03/14/2016 Document Reviewed: 11/11/2014 Elsevier Interactive Patient Education  2017 [employer].

## 2022-12-18 NOTE — Progress Notes (Signed)
I connected with  Dawn Wang on 12/18/22 by a audio enabled telemedicine application and verified that I am speaking with the correct person using two identifiers.  Patient Location: Home  Provider Location: Office/Clinic  I discussed the limitations of evaluation and management by telemedicine. The patient expressed understanding and agreed to proceed.  Subjective:   Dawn Wang is a 77 y.o. female who presents for Medicare Annual (Subsequent) preventive examination.  Review of Systems     Cardiac Risk Factors include: advanced age (>48mn, >>73women);dyslipidemia     Objective:    Today's Vitals   12/18/22 1056 12/18/22 1057  Weight: 140 lb (63.5 kg)   Height: '5\' 1"'$  (1.549 m)   PainSc:  3    Body mass index is 26.45 kg/m.     12/18/2022   11:00 AM 11/29/2021    9:41 AM 11/22/2020   10:25 AM 06/30/2019   12:08 PM 09/15/2018    2:19 PM  Advanced Directives  Does Patient Have a Medical Advance Directive? No No No No No  Would patient like information on creating a medical advance directive?  No - Patient declined Yes (MAU/Ambulatory/Procedural Areas - Information given)  Yes (MAU/Ambulatory/Procedural Areas - Information given)    Current Medications (verified) No outpatient encounter medications on file as of 12/18/2022.   No facility-administered encounter medications on file as of 12/18/2022.    Allergies (verified) Patient has no known allergies.   History: History reviewed. No pertinent past medical history. History reviewed. No pertinent surgical history. Family History  Problem Relation Age of Onset   Cancer Mother    Diabetes Father    Hypertension Father    Social History   Socioeconomic History   Marital status: Married    Spouse name: Not on file   Number of children: Not on file   Years of education: Not on file   Highest education level: Not on file  Occupational History   Occupation: retired  Tobacco Use   Smoking status: Never     Passive exposure: Past   Smokeless tobacco: Never  Vaping Use   Vaping Use: Never used  Substance and Sexual Activity   Alcohol use: Not Currently   Drug use: Not Currently   Sexual activity: Yes  Other Topics Concern   Not on file  Social History Narrative   Not on file   Social Determinants of Health   Financial Resource Strain: Low Risk  (12/18/2022)   Overall Financial Resource Strain (CARDIA)    Difficulty of Paying Living Expenses: Not hard at all  Food Insecurity: No Food Insecurity (12/18/2022)   Hunger Vital Sign    Worried About Running Out of Food in the Last Year: Never true    RElk [place] the Last Year: Never true  Transportation Needs: No Transportation Needs (12/18/2022)   PRAPARE - THydrologist(Medical): No    Lack of Transportation (Non-Medical): No  Physical Activity: Insufficiently Active (12/18/2022)   Exercise Vital Sign    Days of Exercise per Week: 2 days    Minutes of Exercise per Session: 20 min  Stress: No Stress Concern Present (12/18/2022)   [place]   Feeling of Stress : Not at all  Social Connections: Moderately Integrated (09/15/2018)   Social Connection and Isolation Panel [NHANES]    Frequency of Communication with Friends and Family: More than three times a week  Frequency of Social Gatherings with Friends and Family: Once a week    Attends Religious Services: More than 4 times per year    Active Member of [employer] or Organizations: No    Attends Music therapist: Never    Marital Status: Married    Tobacco Counseling Counseling given: Not Answered   Clinical Intake:  Pre-visit preparation completed: Yes  Pain : 0-10 Pain Score: 3  Pain Type: Acute pain Pain Location: Groin Pain Orientation: Right Pain Descriptors / Indicators: Aching Pain Onset: More than a month ago Pain Frequency: Constant     Nutritional  Status: BMI 25 -29 Overweight Nutritional Risks: None Diabetes: No  How often do you need to have someone help you when you read instructions, pamphlets, or other written materials from your doctor or pharmacy?: 1 - Never  Diabetic? no  Interpreter Needed?: No  Information entered by :: NAllen LPN   Activities of Daily Living    12/18/2022   11:01 AM  In your present state of health, do you have any difficulty performing the following activities:  Hearing? 0  Vision? 0  Difficulty concentrating or making decisions? 0  Walking or climbing stairs? 0  Dressing or bathing? 0  Doing errands, shopping? 0  Preparing Food and eating ? N  Using the Toilet? N  In the past six months, have you accidently leaked urine? N  Do you have problems with loss of bowel control? N  Managing your Medications? N  Managing your Finances? N  Housekeeping or managing your Housekeeping? N    Patient Care Team: [name], FNP as PCP - General (General Practice)  Indicate any recent Medical Services you may have received from other than [facility] providers in the past year (date may be approximate).     Assessment:   This is a routine wellness examination for Dawn Wang.  Hearing/Vision screen Vision Screening - Comments:: Regular eye exams, [facility]  Dietary issues and exercise activities discussed: Current Exercise Habits: Home exercise routine, Type of exercise: stretching, Time (Minutes): 20, Frequency (Times/Week): 2, Weekly Exercise (Minutes/Week): 40   Goals Addressed             This Visit's Progress    Patient Stated       12/18/2022, wants to keep weight down       Depression Screen    12/18/2022   11:00 AM 11/28/2022    2:16 PM 06/03/2022    9:45 AM 11/29/2021    9:42 AM 11/22/2020   10:26 AM 11/22/2020   10:17 AM 11/15/2019   11:32 AM  PHQ 2/9 Scores  PHQ - 2 Score 0 0 0 0 0 0 0    Fall Risk    12/18/2022   11:00 AM 11/28/2022    2:16 PM 06/03/2022    9:45 AM 11/29/2021     9:42 AM 11/22/2020   10:26 AM  Fall Risk   Falls in the past year? 0 0 0 0 0  Number falls in past yr: 0  0    Injury with Fall? 0  0    Risk for fall due to : No Fall Risks  No Fall Risks No Fall Risks No Fall Risks  Follow up Falls prevention discussed;Education provided;Falls evaluation completed  Falls evaluation completed Falls evaluation completed;Education provided;Falls prevention discussed Falls evaluation completed;Education provided;Falls prevention discussed    FALL RISK PREVENTION PERTAINING TO THE HOME:  Any stairs in or around the home? Yes  If so,  are there any without handrails? No  Home free of loose throw rugs in walkways, pet beds, electrical cords, etc? Yes  Adequate lighting in your home to reduce risk of falls? Yes   ASSISTIVE DEVICES UTILIZED TO PREVENT FALLS:  Life alert? No  Use of a cane, walker or w/c? No  Grab bars in the bathroom? Yes  Shower chair or bench in shower? No  Elevated toilet seat or a handicapped toilet? No   TIMED UP AND GO:  Was the test performed? No .  .     Cognitive Function:        12/18/2022   11:01 AM 11/29/2021    9:43 AM 11/22/2020   10:27 AM 06/30/2019   12:11 PM 09/15/2018    2:20 PM  6CIT Screen  What Year? 0 points 0 points 0 points 0 points 0 points  What month? 0 points 0 points 0 points 0 points 0 points  What time? 0 points 0 points 0 points 0 points 0 points  Count back from 20 0 points 0 points 0 points 0 points 0 points  Months in reverse 0 points 0 points 0 points 0 points 0 points  Repeat phrase 2 points 2 points 2 points 0 points 0 points  Total Score 2 points 2 points 2 points 0 points 0 points    Immunizations Immunization History  Administered Date(s) Administered   Fluad Quad(high Dose 65+) 07/03/2019, 07/04/2021   Influenza, High Dose Seasonal PF 08/12/2018   Influenza-Unspecified 07/21/2018, 08/07/2019, 07/16/2020, 06/29/2022   PFIZER(Purple Top)SARS-COV-2 Vaccination 11/11/2019, 12/03/2019,  07/16/2020, 02/09/2021   Pfizer Covid-19 Vaccine Bivalent Booster 67yr & up 07/04/2021, 07/11/2022   Pneumococcal Conjugate-13 10/29/2018   Pneumococcal Polysaccharide-23 11/29/2021   Tdap 11/28/2022   Zoster Recombinat (Shingrix) 07/03/2019, 09/06/2019    TDAP status: Up to date  Flu Vaccine status: Up to date  Pneumococcal vaccine status: Up to date  Covid-19 vaccine status: Completed vaccines  Qualifies for Shingles Vaccine? Yes   Zostavax completed Yes   Shingrix Completed?: Yes  Screening Tests Health Maintenance  Topic Date Due   COVID-19 Vaccine (7 - 2023-24 season) 09/05/2022   Medicare Annual Wellness (AWV)  11/29/2022   MAMMOGRAM  11/08/2024   COLONOSCOPY (Pts 45-483yrInsurance coverage will need to be confirmed)  11/16/2024   DTaP/Tdap/Td (2 - Td or Tdap) 11/28/2032   Pneumonia Vaccine 6537Years old  Completed   INFLUENZA VACCINE  Completed   DEXA SCAN  Completed   Hepatitis C Screening  Completed   Zoster Vaccines- Shingrix  Completed   HPV VACCINES  Aged Out    Health Maintenance  Health Maintenance Due  Topic Date Due   COVID-19 Vaccine (7 - 2023-24 season) 09/05/2022   Medicare Annual Wellness (AWV)  11/29/2022    Colorectal cancer screening: Type of screening: Colonoscopy. Completed 11/17/2019. Repeat every 5 years  Mammogram status: Completed 11/08/2022. Repeat every year  Bone Density status: Completed 11/25/2017.   Lung Cancer Screening: (Low Dose CT Chest recommended if Age 75-80ears, 30 pack-year currently smoking OR have quit w/in 15years.) does not qualify.   Lung Cancer Screening Referral: no  Additional Screening:  Hepatitis C Screening: does qualify; Completed 10/28/2018  Vision Screening: Recommended annual ophthalmology exams for early detection of glaucoma and other disorders of the eye. Is the patient up to date with their annual eye exam?  Yes  Who is the provider or what is the name of the office in which the patient attends  annual eye exams? [facility] If pt is not established with a provider, would they like to be referred to a provider to establish care? No .   Dental Screening: Recommended annual dental exams for proper oral hygiene  Community Resource Referral / Chronic Care Management: CRR required this visit?  No   CCM required this visit?  No      Plan:     I have personally reviewed and noted the following in the patient's chart:   Medical and social history Use of alcohol, tobacco or illicit drugs  Current medications and supplements including opioid prescriptions. Patient is not currently taking opioid prescriptions. Functional ability and status Nutritional status Physical activity Advanced directives List of other physicians Hospitalizations, surgeries, and ER visits in previous 12 months Vitals Screenings to include cognitive, depression, and falls Referrals and appointments  In addition, I have reviewed and discussed with patient certain preventive protocols, quality metrics, and best practice recommendations. A written personalized care plan for preventive services as well as general preventive health recommendations were provided to patient.     Kellie Simmering, LPN   [id number]   Nurse Notes: none  Due to this being a virtual visit, the after visit summary with patients personalized plan was offered to patient via mail or my-chart. Patient would like to access on my-chart

## 2023-03-03 ENCOUNTER — Encounter: Payer: Self-pay | Admitting: Nurse Practitioner

## 2023-05-19 ENCOUNTER — Encounter: Payer: Self-pay | Admitting: Nurse Practitioner

## 2023-05-19 ENCOUNTER — Ambulatory Visit (INDEPENDENT_AMBULATORY_CARE_PROVIDER_SITE_OTHER): Payer: Medicare PPO | Admitting: Nurse Practitioner

## 2023-05-19 VITALS — BP 110/64 | HR 70 | Temp 98.5°F | Ht 61.0 in | Wt 139.8 lb

## 2023-05-19 DIAGNOSIS — H6122 Impacted cerumen, left ear: Secondary | ICD-10-CM | POA: Insufficient documentation

## 2023-05-19 DIAGNOSIS — R7303 Prediabetes: Secondary | ICD-10-CM | POA: Diagnosis not present

## 2023-05-19 DIAGNOSIS — E782 Mixed hyperlipidemia: Secondary | ICD-10-CM

## 2023-05-19 DIAGNOSIS — Z0000 Encounter for general adult medical examination without abnormal findings: Secondary | ICD-10-CM | POA: Diagnosis not present

## 2023-05-19 LAB — CMP14+EGFR
ALT: 11 IU/L (ref 0–32)
AST: 16 IU/L (ref 0–40)
Albumin: 3.8 g/dL (ref 3.8–4.8)
Alkaline Phosphatase: 76 IU/L (ref 44–121)
BUN/Creatinine Ratio: 12 (ref 12–28)
BUN: 11 mg/dL (ref 8–27)
Bilirubin Total: 0.2 mg/dL (ref 0.0–1.2)
CO2: 24 mmol/L (ref 20–29)
Calcium: 9.1 mg/dL (ref 8.7–10.3)
Chloride: 106 mmol/L (ref 96–106)
Creatinine, Ser: 0.93 mg/dL (ref 0.57–1.00)
Globulin, Total: 2.5 g/dL (ref 1.5–4.5)
Glucose: 79 mg/dL (ref 70–99)
Potassium: 4.1 mmol/L (ref 3.5–5.2)
Sodium: 142 mmol/L (ref 134–144)
Total Protein: 6.3 g/dL (ref 6.0–8.5)
eGFR: 64 mL/min/{1.73_m2} (ref >59)

## 2023-05-19 LAB — LIPID PANEL
Chol/HDL Ratio: 3.1 ratio (ref 0.0–4.4)
Cholesterol, Total: 151 mg/dL (ref 100–199)
HDL: 48 mg/dL (ref >39)
LDL Chol Calc (NIH): 84 mg/dL (ref 0–99)
Triglycerides: 102 mg/dL (ref 0–149)
VLDL Cholesterol Cal: 19 mg/dL (ref 5–40)

## 2023-05-19 LAB — HEMOGLOBIN A1C
Est. average glucose Bld gHb Est-mCnc: 123 mg/dL
Hgb A1c MFr Bld: 5.9 % — ABNORMAL HIGH (ref 4.8–5.6)

## 2023-05-19 LAB — CBC WITH DIFFERENTIAL/PLATELET
Basophils Absolute: 0 10*3/uL (ref 0.0–0.2)
Basos: 1 %
EOS (ABSOLUTE): 0.2 10*3/uL (ref 0.0–0.4)
Eos: 4 %
Hematocrit: 37 % (ref 34.0–46.6)
Hemoglobin: 12.1 g/dL (ref 11.1–15.9)
Immature Grans (Abs): 0 10*3/uL (ref 0.0–0.1)
Immature Granulocytes: 0 %
Lymphocytes Absolute: 2.2 10*3/uL (ref 0.7–3.1)
Lymphs: 40 %
MCH: 29.8 pg (ref 26.6–33.0)
MCHC: 32.7 g/dL (ref 31.5–35.7)
MCV: 91 fL (ref 79–97)
Monocytes Absolute: 0.6 10*3/uL (ref 0.1–0.9)
Monocytes: 10 %
Neutrophils Absolute: 2.5 10*3/uL (ref 1.4–7.0)
Neutrophils: 45 %
Platelets: 199 10*3/uL (ref 150–450)
RBC: 4.06 x10E6/uL (ref 3.77–5.28)
RDW: 12.8 % (ref 11.7–15.4)
WBC: 5.5 10*3/uL (ref 3.4–10.8)

## 2023-05-19 MED ORDER — MULTIVITAMIN WOMEN 50+ PO TABS: 1.0000 | ORAL_TABLET | Freq: Every day | ORAL | Status: AC

## 2023-05-19 NOTE — Assessment & Plan Note (Signed)
HgbA1c was slightly up at last visit continue to limit intake of sugary foods and drinks and regular exercise.

## 2023-05-19 NOTE — Assessment & Plan Note (Signed)
Wax is removed by with lavage with elephant ear with 1/2 water and 1/2 peroxide. Instructions for home care to prevent wax buildup are given.  Encouraged to avoid using Q-tips

## 2023-05-19 NOTE — Patient Instructions (Addendum)
If you begin to have problems with your right thumb/hand to include pain or stiffness return to office. Also try to strengthen muscle with hand grip device.

## 2023-05-19 NOTE — Assessment & Plan Note (Signed)
Cholesterol levels are slightly lower with the LDLs, encouraged to eat a low fat diet. Increase fiber intake.

## 2023-05-19 NOTE — Assessment & Plan Note (Signed)

## 2023-05-19 NOTE — Progress Notes (Signed)
Madelaine Bhat, CMA,acting as a Neurosurgeon for Arnette Felts, FNP.,have documented all relevant documentation on the behalf of Arnette Felts, FNP,as directed by  Arnette Felts, FNP while in the presence of Arnette Felts, FNP.  Subjective:    Patient ID: Dawn Wang , female    DOB: January 01, 1946 , 77 y.o.   MRN: [id number]  Chief Complaint  Patient presents with   Annual Exam    HPI  Patient presents today for HM, patient reports compliance with medications. Patient denies any chest pain, SOB, or headaches. Patient has no other concerns today.      History reviewed. No pertinent past medical history.   Family History  Problem Relation Age of Onset   Cancer Mother    Diabetes Father    Hypertension Father      Current Outpatient Medications:    Multiple Vitamins-Minerals (MULTIVITAMIN WOMEN 50+) TABS, Take 1 tablet by mouth daily., Disp: , Rfl:    No Known Allergies    The patient states she is status post hysterectomy. No LMP recorded. Patient has had a hysterectomy.. . Negative for: breast discharge, breast lump(s), breast pain and breast self exam. Associated symptoms include abnormal vaginal bleeding. Pertinent negatives include abnormal bleeding (hematology), anxiety, decreased libido, depression, difficulty falling sleep, dyspareunia, history of infertility, nocturia, sexual dysfunction, sleep disturbances, urinary incontinence, urinary urgency, vaginal discharge and vaginal itching. Diet regular. The patient states her exercise level is minimal since [place] ended in May. She had pulled a muscle to her right thigh after not stretching prior to bowling in January.   The patient's tobacco use is:  Social History   Tobacco Use  Smoking Status Never   Passive exposure: Past  Smokeless Tobacco Never   She has been exposed to passive smoke. The patient's alcohol use is:  Social History   Substance and Sexual Activity  Alcohol Use Not Currently    Review of Systems   Constitutional: Negative.   HENT: Negative.    Eyes: Negative.   Respiratory: Negative.    Cardiovascular: Negative.   Gastrointestinal: Negative.   Endocrine: Negative.   Genitourinary: Negative.   Musculoskeletal:  Negative for myalgias.  Skin: Negative.   Allergic/Immunologic: Negative.   Neurological: Negative.   Hematological: Negative.   Psychiatric/Behavioral: Negative.       Today's Vitals   05/19/23 0958  BP: 110/64  Pulse: 70  Temp: 98.5 F (36.9 C)  TempSrc: Oral  Weight: 139 lb 12.8 oz (63.4 kg)  Height: 5\' 1"  (1.549 m)  PainSc: 0-No pain   Body mass index is 26.41 kg/m.  Wt Readings from Last 3 Encounters:  05/19/23 139 lb 12.8 oz (63.4 kg)  12/18/22 140 lb (63.5 kg)  11/28/22 142 lb (64.4 kg)     Objective:  Physical Exam Vitals reviewed.  Constitutional:      General: She is not in acute distress.    Appearance: Normal appearance. She is well-developed.  HENT:     Head: Normocephalic and atraumatic.     Right Ear: Hearing, tympanic membrane, ear canal and external ear normal. There is no impacted cerumen.     Left Ear: Hearing and external ear normal. There is impacted cerumen.     Nose: Nose normal.     Mouth/Throat:     Mouth: Mucous membranes are moist.  Eyes:     General: Lids are normal.     Extraocular Movements: Extraocular movements intact.     Conjunctiva/sclera: Conjunctivae normal.     Pupils:  Pupils are equal, round, and reactive to light.     Funduscopic exam:    Right eye: No papilledema.        Left eye: No papilledema.  Neck:     Thyroid: No thyroid mass.     Vascular: No carotid bruit.  Cardiovascular:     Rate and Rhythm: Normal rate and regular rhythm.     Pulses: Normal pulses.     Heart sounds: Normal heart sounds. No murmur heard. Pulmonary:     Effort: Pulmonary effort is normal. No respiratory distress.     Breath sounds: Normal breath sounds. No wheezing.  Chest:  Breasts:    Right: Normal. No mass or  tenderness.     Left: Normal. No mass or tenderness.  Abdominal:     General: Abdomen is flat. Bowel sounds are normal. There is no distension.     Palpations: Abdomen is soft.     Tenderness: There is no abdominal tenderness.  Musculoskeletal:        General: No swelling or tenderness. Normal range of motion.     Cervical back: Full passive range of motion without pain, normal range of motion and neck supple.     Right lower leg: No edema.     Left lower leg: No edema.     Comments: Right hand thumb palmar surface with decrease in muscle  Skin:    General: Skin is warm and dry.     Capillary Refill: Capillary refill takes less than 2 seconds.  Neurological:     General: No focal deficit present.     Mental Status: She is alert and oriented to person, place, and time.     Cranial Nerves: No cranial nerve deficit.     Sensory: No sensory deficit.     Motor: No weakness.  Psychiatric:        Mood and Affect: Mood normal.        Behavior: Behavior normal.        Thought Content: Thought content normal.        Judgment: Judgment normal.         Assessment And Plan:     Encounter for annual health examination Assessment & Plan: Behavior modifications discussed and diet history reviewed.   Pt will continue to exercise regularly and modify diet with low GI, plant based foods and decrease intake of processed foods.  Recommend intake of daily multivitamin, Vitamin D, and calcium.  Recommend mammogram and colonoscopy for preventive screenings, as well as recommend immunizations that include influenza, TDAP, and Shingles (up to date)   Orders: -     CBC with Differential/Platelet  Mixed hyperlipidemia Assessment & Plan: Cholesterol levels are slightly lower with the LDLs, encouraged to eat a low fat diet. Increase fiber intake.   Orders: -     CBC with Differential/Platelet -     CMP14+EGFR -     Lipid panel  Prediabetes Assessment & Plan: HgbA1c was slightly up at last  visit continue to limit intake of sugary foods and drinks and regular exercise.  Orders: -     Hemoglobin A1c  Impacted cerumen of left ear Assessment & Plan: Wax is removed by with lavage with elephant ear with 1/2 water and 1/2 peroxide. Instructions for home care to prevent wax buildup are given.  Encouraged to avoid using Q-tips   Orders: -     Ear Lavage  Other orders -     Multivitamin Women 50+; Take 1  tablet by mouth daily.     Return for 1 year physical, 6 month chol check. Patient was given opportunity to ask questions. Patient verbalized understanding of the plan and was able to repeat key elements of the plan. All questions were answered to their satisfaction.   Arnette Felts, FNP  I, Arnette Felts, FNP, have reviewed all documentation for this visit. The documentation on 05/19/23 for the exam, diagnosis, procedures, and orders are all accurate and complete.

## 2023-09-25 ENCOUNTER — Encounter: Payer: Self-pay | Admitting: Nurse Practitioner

## 2023-09-26 ENCOUNTER — Other Ambulatory Visit: Payer: Self-pay | Admitting: Internal Medicine

## 2023-09-26 DIAGNOSIS — Z1231 Encounter for screening mammogram for malignant neoplasm of breast: Secondary | ICD-10-CM

## 2023-10-07 DIAGNOSIS — R7303 Prediabetes: Secondary | ICD-10-CM | POA: Diagnosis not present

## 2023-10-07 DIAGNOSIS — E785 Hyperlipidemia, unspecified: Secondary | ICD-10-CM | POA: Diagnosis not present

## 2023-10-07 DIAGNOSIS — N182 Chronic kidney disease, stage 2 (mild): Secondary | ICD-10-CM | POA: Diagnosis not present

## 2023-10-07 DIAGNOSIS — Z8249 Family history of ischemic heart disease and other diseases of the circulatory system: Secondary | ICD-10-CM | POA: Diagnosis not present

## 2023-11-11 ENCOUNTER — Ambulatory Visit
Admission: RE | Admit: 2023-11-11 | Discharge: 2023-11-11 | Disposition: A | Discharge status: Home / Self Care | Payer: Medicare PPO | Source: Ambulatory Visit

## 2023-11-11 DIAGNOSIS — Z1231 Encounter for screening mammogram for malignant neoplasm of breast: Secondary | ICD-10-CM

## 2023-11-12 ENCOUNTER — Encounter: Payer: Self-pay | Admitting: Nurse Practitioner

## 2023-11-18 NOTE — Progress Notes (Unsigned)
Madelaine Bhat, CMA,acting as a Neurosurgeon for Arnette Felts, FNP.,have documented all relevant documentation on the behalf of Arnette Felts, FNP,as directed by  Arnette Felts, FNP while in the presence of Arnette Felts, FNP.  Subjective:  Patient ID: Dawn Wang , female    DOB: [birth year] , 78 y.o.   MRN: [id number]  No chief complaint on file.   HPI  Patient presents today for a chol and pre dm follow up, Patient reports compliance with medication. Patient denies any chest pain, SOB, or headaches. Patient has no concerns today.     No past medical history on file.   Family History  Problem Relation Age of Onset  . Cancer Mother   . Diabetes Father   . Hypertension Father      Current Outpatient Medications:  Marland Kitchen  Multiple Vitamins-Minerals (MULTIVITAMIN WOMEN 50+) TABS, Take 1 tablet by mouth daily., Disp: , Rfl:    No Known Allergies   Review of Systems   There were no vitals filed for this visit. There is no height or weight on file to calculate BMI.  Wt Readings from Last 3 Encounters:  05/19/23 139 lb 12.8 oz (63.4 kg)  12/18/22 140 lb (63.5 kg)  11/28/22 142 lb (64.4 kg)    The 10-year ASCVD risk score (Arnett DK, et al., 2019) is: 9.9%   Values used to calculate the score:     Age: 89 years     Sex: Female     Is Non-Hispanic African American: Yes     Diabetic: No     Tobacco smoker: No     Systolic Blood Pressure: 110 mmHg     Is BP treated: No     HDL Cholesterol: 48 mg/dL     Total Cholesterol: 151 mg/dL  Objective:  Physical Exam      Assessment And Plan:  Mixed hyperlipidemia  Prediabetes    No follow-ups on file.  Patient was given opportunity to ask questions. Patient verbalized understanding of the plan and was able to repeat key elements of the plan. All questions were answered to their satisfaction.    Jeanell Sparrow, FNP, have reviewed all documentation for this visit. The documentation on 11/18/23 for the exam, diagnosis, procedures, and  orders are all accurate and complete.   IF YOU HAVE BEEN REFERRED TO A SPECIALIST, IT MAY TAKE 1-2 WEEKS TO SCHEDULE/PROCESS THE REFERRAL. IF YOU HAVE NOT HEARD FROM US/SPECIALIST IN TWO WEEKS, PLEASE GIVE [place] A CALL AT [phone].

## 2023-11-19 ENCOUNTER — Ambulatory Visit: Payer: Medicare PPO | Admitting: Nurse Practitioner

## 2023-11-19 ENCOUNTER — Encounter: Payer: Self-pay | Admitting: Nurse Practitioner

## 2023-11-19 VITALS — BP 120/70 | HR 64 | Temp 98.4°F | Ht 61.0 in | Wt 141.0 lb

## 2023-11-19 DIAGNOSIS — R7303 Prediabetes: Secondary | ICD-10-CM | POA: Diagnosis not present

## 2023-11-19 DIAGNOSIS — Z2821 Immunization not carried out because of patient refusal: Secondary | ICD-10-CM | POA: Diagnosis not present

## 2023-11-19 DIAGNOSIS — E782 Mixed hyperlipidemia: Secondary | ICD-10-CM | POA: Diagnosis not present

## 2023-11-19 DIAGNOSIS — Z6826 Body mass index (BMI) 26.0-26.9, adult: Secondary | ICD-10-CM

## 2023-11-19 DIAGNOSIS — E663 Overweight: Secondary | ICD-10-CM | POA: Diagnosis not present

## 2023-11-19 NOTE — Assessment & Plan Note (Signed)
Cholesterol levels are normal, will not check labs today.  Encouraged to eat a low fat diet. Increase fiber intake.

## 2023-11-19 NOTE — Assessment & Plan Note (Signed)

## 2023-11-19 NOTE — Assessment & Plan Note (Signed)
HgbA1c was 5.9 at last visit, continue focusing on healthy diet and regular exercise at least 150 minutes per week.

## 2023-12-24 ENCOUNTER — Ambulatory Visit: Payer: Medicare PPO

## 2023-12-24 DIAGNOSIS — Z0000 Encounter for general adult medical examination without abnormal findings: Secondary | ICD-10-CM

## 2023-12-24 NOTE — Progress Notes (Signed)
 Subjective:   Dawn Wang is a 78 y.o. who presents for a Medicare Wellness preventive visit.  Visit Complete: Virtual I connected with  Dawn Wang on 12/24/23 by a audio enabled telemedicine application and verified that I am speaking with the correct person using two identifiers.  Patient Location: Home  Provider Location: Office/Clinic  I discussed the limitations of evaluation and management by telemedicine. The patient expressed understanding and agreed to proceed.  Vital Signs: Because this visit was a virtual/telehealth visit, some criteria may be missing or patient reported. Any vitals not documented were not able to be obtained and vitals that have been documented are patient reported.  VideoError- Librarian, academic were attempted between this provider and patient, however failed, due to patient having technical difficulties OR patient did not have access to video capability.  We continued and completed visit with audio only.   AWV Questionnaire: Yes: Patient Medicare AWV questionnaire was completed by the patient on 12/24/2023; I have confirmed that all information answered by patient is correct and no changes since this date.  Cardiac Risk Factors include: advanced age (>23men, >70 women);dyslipidemia     Objective:    Today's Vitals   There is no height or weight on file to calculate BMI.     12/24/2023   11:42 AM 12/18/2022   11:00 AM 11/29/2021    9:41 AM 11/22/2020   10:25 AM 06/30/2019   12:08 PM 09/15/2018    2:19 PM  Advanced Directives  Does Patient Have a Medical Advance Directive? No No No No No No  Would patient like information on creating a medical advance directive?   No - Patient declined Yes (MAU/Ambulatory/Procedural Areas - Information given)  Yes (MAU/Ambulatory/Procedural Areas - Information given)    Current Medications (verified) Outpatient Encounter Medications as of 12/24/2023  Medication Sig   Multiple  Vitamins-Minerals (MULTIVITAMIN WOMEN 50+) TABS Take 1 tablet by mouth daily.   No facility-administered encounter medications on file as of 12/24/2023.    Allergies (verified) Patient has no known allergies.   History: History reviewed. No pertinent past medical history. History reviewed. No pertinent surgical history. Family History  Problem Relation Age of Onset   Cancer Mother    Diabetes Father    Hypertension Father    Social History   Socioeconomic History   Marital status: Married    Spouse name: Not on file   Number of children: Not on file   Years of education: Not on file   Highest education level: Master's degree (e.g., MA, MS, MEng, MEd, MSW, MBA)  Occupational History   Occupation: retired  Tobacco Use   Smoking status: Never    Passive exposure: Past   Smokeless tobacco: Never  Vaping Use   Vaping status: Never Used  Substance and Sexual Activity   Alcohol use: Not Currently   Drug use: Not Currently   Sexual activity: Yes  Other Topics Concern   Not on file  Social History Narrative   Not on file   Social Drivers of Health   Financial Resource Strain: Low Risk  (12/24/2023)   Overall Financial Resource Strain (CARDIA)    Difficulty of Paying Living Expenses: Not hard at all  Food Insecurity: No Food Insecurity (12/24/2023)   Hunger Vital Sign    Worried About Running Out of Food in the Last Year: Never true    Ran Out of Food in the Last Year: Never true  Transportation Needs: No Transportation Needs (  12/24/2023)   PRAPARE - Administrator, Civil Service (Medical): No    Lack of Transportation (Non-Medical): No  Physical Activity: Insufficiently Active (12/24/2023)   Exercise Vital Sign    Days of Exercise per Week: 1 day    Minutes of Exercise per Session: 20 min  Stress: No Stress Concern Present (12/24/2023)   [employer] of Occupational Health - Occupational Stress Questionnaire    Feeling of Stress : Not at all  Social  Connections: Socially Integrated (12/24/2023)   Social Connection and Isolation Panel [NHANES]    Frequency of Communication with Friends and Family: More than three times a week    Frequency of Social Gatherings with Friends and Family: Twice a week    Attends Religious Services: More than 4 times per year    Active Member of [employer] or Organizations: Yes    Attends Engineer, structural: More than 4 times per year    Marital Status: Married    Tobacco Counseling Counseling given: Not Answered    Clinical Intake:  Pre-visit preparation completed: Yes  Pain : No/denies pain     Nutritional Risks: None Diabetes: No  How often do you need to have someone help you when you read instructions, pamphlets, or other written materials from your doctor or pharmacy?: 1 - Never  Interpreter Needed?: No  Information entered by :: NAllen LPN   Activities of Daily Living     12/24/2023   10:53 AM  In your present state of health, do you have any difficulty performing the following activities:  Hearing? 0  Vision? 0  Difficulty concentrating or making decisions? 0  Walking or climbing stairs? 0  Dressing or bathing? 0  Doing errands, shopping? 0  Preparing Food and eating ? N  Using the Toilet? N  In the past six months, have you accidently leaked urine? N  Do you have problems with loss of bowel control? N  Managing your Medications? N  Managing your Finances? N  Housekeeping or managing your Housekeeping? N    Patient Care Team: Arnette Felts, FNP as PCP - General (General Practice)  Indicate any recent Medical Services you may have received from other than [facility] providers in the past year (date may be approximate).     Assessment:   This is a routine wellness examination for Dawn Wang.  Hearing/Vision screen Hearing Screening - Comments:: Denies hearing issues Vision Screening - Comments:: Regular eye exams, [facility]   Goals Addressed             This  Visit's Progress    Patient Stated       12/24/2023, maintain weight and watch A1C       Depression Screen     12/24/2023   11:43 AM 05/19/2023   10:03 AM 12/18/2022   11:00 AM 11/28/2022    2:16 PM 06/03/2022    9:45 AM 11/29/2021    9:42 AM 11/22/2020   10:26 AM  PHQ 2/9 Scores  PHQ - 2 Score 0 0 0 0 0 0 0  PHQ- 9 Score 0 1         Fall Risk     12/24/2023   10:53 AM 05/19/2023   10:02 AM 12/18/2022   11:00 AM 11/28/2022    2:16 PM 06/03/2022    9:45 AM  Fall Risk   Falls in the past year? 0 1 0 0 0  Number falls in past yr: 0 0 0  0  Injury with Fall? 0 0 0  0  Risk for fall due to : No Fall Risks No Fall Risks No Fall Risks  No Fall Risks  Follow up Falls prevention discussed;Falls evaluation completed Falls evaluation completed Falls prevention discussed;Education provided;Falls evaluation completed  Falls evaluation completed    MEDICARE RISK AT HOME:  Medicare Risk at Home Any stairs in or around the home?: (Patient-Rptd) Yes If so, are there any without handrails?: (Patient-Rptd) Yes Home free of loose throw rugs in walkways, pet beds, electrical cords, etc?: (Patient-Rptd) Yes Adequate lighting in your home to reduce risk of falls?: (Patient-Rptd) Yes Life alert?: (Patient-Rptd) No Use of a cane, walker or w/c?: (Patient-Rptd) No Grab bars in the bathroom?: (Patient-Rptd) Yes Shower chair or bench in shower?: (Patient-Rptd) No Elevated toilet seat or a handicapped toilet?: (Patient-Rptd) No  TIMED UP AND GO:  Was the test performed?  No  Cognitive Function: 6CIT completed        12/24/2023   11:43 AM 12/18/2022   11:01 AM 11/29/2021    9:43 AM 11/22/2020   10:27 AM 06/30/2019   12:11 PM  6CIT Screen  What Year? 0 points 0 points 0 points 0 points 0 points  What month? 0 points 0 points 0 points 0 points 0 points  What time? 0 points 0 points 0 points 0 points 0 points  Count back from 20 0 points 0 points 0 points 0 points 0 points  Months in reverse 0 points 0 points  0 points 0 points 0 points  Repeat phrase 0 points 2 points 2 points 2 points 0 points  Total Score 0 points 2 points 2 points 2 points 0 points    Immunizations Immunization History  Administered Date(s) Administered   Fluad Quad(high Dose 65+) 07/03/2019, 07/04/2021   Influenza, High Dose Seasonal PF 08/12/2018   Influenza-Unspecified 07/21/2018, 08/07/2019, 07/16/2020, 06/29/2022, 07/15/2023   PFIZER(Purple Top)SARS-COV-2 Vaccination 11/11/2019, 12/03/2019, 07/16/2020, 02/09/2021, 06/18/2023   Pfizer Covid-19 Vaccine Bivalent Booster 1yrs & up 07/04/2021, 07/11/2022   Pneumococcal Conjugate-13 10/29/2018   Pneumococcal Polysaccharide-23 11/29/2021   Tdap 11/28/2022   Zoster Recombinant(Shingrix) 07/03/2019, 09/06/2019    Screening Tests Health Maintenance  Topic Date Due   COVID-19 Vaccine (8 - 2024-25 season) 08/13/2023   Colonoscopy  11/16/2024   Medicare Annual Wellness (AWV)  12/23/2024   DTaP/Tdap/Td (2 - Td or Tdap) 11/28/2032   Pneumonia Vaccine 12+ Years old  Completed   INFLUENZA VACCINE  Completed   DEXA SCAN  Completed   Hepatitis C Screening  Completed   Zoster Vaccines- Shingrix  Completed   HPV VACCINES  Aged Out    Health Maintenance  Health Maintenance Due  Topic Date Due   COVID-19 Vaccine (8 - 2024-25 season) 08/13/2023   Health Maintenance Items Addressed: Up to date  Additional Screening:  Vision Screening: Recommended annual ophthalmology exams for early detection of glaucoma and other disorders of the eye.  Dental Screening: Recommended annual dental exams for proper oral hygiene  Community Resource Referral / Chronic Care Management: CRR required this visit?  No   CCM required this visit?  No     Plan:     I have personally reviewed and noted the following in the patient's chart:   Medical and social history Use of alcohol, tobacco or illicit drugs  Current medications and supplements including opioid prescriptions. Patient is  not currently taking opioid prescriptions. Functional ability and status Nutritional status Physical activity Advanced directives List of other physicians  Hospitalizations, surgeries, and ER visits in previous 12 months Vitals Screenings to include cognitive, depression, and falls Referrals and appointments  In addition, I have reviewed and discussed with patient certain preventive protocols, quality metrics, and best practice recommendations. A written personalized care plan for preventive services as well as general preventive health recommendations were provided to patient.     Barb Merino, LPN   05/22/9561   After Visit Summary: (MyChart) Due to this being a telephonic visit, the after visit summary with patients personalized plan was offered to patient via MyChart   Notes: Nothing significant to report at this time.

## 2023-12-24 NOTE — Patient Instructions (Signed)
 [name] , Thank you for taking time to come for your Medicare Wellness Visit. I appreciate your ongoing commitment to your health goals. Please review the following plan we discussed and let me know if I can assist you in the future.   Referrals/Orders/Follow-Ups/Clinician Recommendations: none  This is a list of the screening recommended for you and due dates:  Health Maintenance  Topic Date Due   COVID-19 Vaccine (8 - 2024-25 season) 08/13/2023   Colon Cancer Screening  11/16/2024   Medicare Annual Wellness Visit  12/23/2024   DTaP/Tdap/Td vaccine (2 - Td or Tdap) 11/28/2032   Pneumonia Vaccine  Completed   Flu Shot  Completed   DEXA scan (bone density measurement)  Completed   Hepatitis C Screening  Completed   Zoster (Shingles) Vaccine  Completed   HPV Vaccine  Aged Out    Advanced directives: (ACP Link)Information on Advanced Care Planning can be found at [facility] [place] Advance Health Care Directives Advance Health Care Directives ([email or web address]   Next Medicare Annual Wellness Visit scheduled for next year: No, office will schedule  insert Preventive Care attachment Insert FALL PREVENTION attachment if needed

## 2024-05-20 ENCOUNTER — Encounter: Payer: Medicare PPO | Admitting: Nurse Practitioner

## 2024-07-01 DIAGNOSIS — Z23 Encounter for immunization: Secondary | ICD-10-CM | POA: Diagnosis not present

## 2024-08-17 ENCOUNTER — Encounter: Payer: Self-pay | Admitting: Nurse Practitioner

## 2024-09-02 ENCOUNTER — Other Ambulatory Visit: Payer: Self-pay | Admitting: Internal Medicine

## 2024-09-02 DIAGNOSIS — Z1231 Encounter for screening mammogram for malignant neoplasm of breast: Secondary | ICD-10-CM

## 2024-10-26 ENCOUNTER — Ambulatory Visit (INDEPENDENT_AMBULATORY_CARE_PROVIDER_SITE_OTHER): Admitting: Nurse Practitioner

## 2024-10-26 VITALS — BP 114/80 | HR 86 | Temp 98.6°F | Ht 61.0 in | Wt 146.0 lb

## 2024-10-26 DIAGNOSIS — E782 Mixed hyperlipidemia: Secondary | ICD-10-CM | POA: Diagnosis not present

## 2024-10-26 DIAGNOSIS — Z0000 Encounter for general adult medical examination without abnormal findings: Secondary | ICD-10-CM | POA: Diagnosis not present

## 2024-10-26 DIAGNOSIS — R7303 Prediabetes: Secondary | ICD-10-CM | POA: Diagnosis not present

## 2024-10-26 NOTE — Assessment & Plan Note (Signed)
 HgbA1c was up to 6 at last visit, continue focusing on healthy diet and regular exercise at least 150 minutes per week.

## 2024-10-26 NOTE — Progress Notes (Signed)
 I,Dawn Wang, CMA,acting as a neurosurgeon for [employer], FNP.,have documented all relevant documentation on the behalf of Dawn Ada, FNP,as directed by  Dawn Ada, FNP while in the presence of Dawn Ada, FNP.  Subjective:    Patient ID: Dawn Wang , female    DOB: Dec 11, 1945 , 79 y.o.   MRN: [id number]  Chief Complaint  Patient presents with   Annual Exam    Patient presents today for HM, patient reports compliance with medications. Patient denies any chest pain, SOB, or headaches. Patient has no other concerns today.      HPI  Discussed the use of AI scribe software for clinical note transcription with the patient, who gave verbal consent to proceed.  History of Present Illness Dawn Wang is a 79 year old female who presents for an annual physical exam.  She recently received both her COVID and flu vaccinations at [facility] and has not seen any other doctors since her last visit. She is scheduled for a colonoscopy on February 2nd and a mammogram on January 22nd. An eye exam is also planned soon.  She underwent a hysterectomy approximately 40 years ago. About two years ago, she attended a prediabetes class and tries to maintain her blood sugar levels within range. She notes some dietary indulgence over the Christmas period, which she attributes to a slight weight increase. Her weight is currently around 146 pounds, which she attributes to holiday eating, particularly 'cakes and pies'.  She does not follow a specific eating regimen but is conscious of her diet. She engages in physical activity at home, including using stairs and yard work, but does not attend a gym. She reports no current medication use.  No swelling in ankles, constipation, or diarrhea. She performs self-breast exams.  History reviewed. No pertinent past medical history.   Family History  Problem Relation Age of Onset   Cancer Mother    Diabetes Father    Hypertension Father     Current  Medications[1]   Allergies[2]    The patient states she uses status post hysterectomy for birth control. No LMP recorded. Patient has had a hysterectomy.   Negative for: breast discharge, breast lump(s), breast pain and breast self exam. Associated symptoms include abnormal vaginal bleeding. Pertinent negatives include abnormal bleeding (hematology), anxiety, decreased libido, depression, difficulty falling sleep, dyspareunia, history of infertility, nocturia, sexual dysfunction, sleep disturbances, urinary incontinence, urinary urgency, vaginal discharge and vaginal itching. Diet regular; she tries to minimize her intake of sugary foods and carbs after going to the prediabetes class.The patient states her exercise level is minimal - exercises at home with her stairs and she works in the yard.  The patient's tobacco use is: Tobacco Use History[3]. She has been exposed to passive smoke. The patient's alcohol use is:  Social History   Substance and Sexual Activity  Alcohol Use Not Currently   Review of Systems  Constitutional: Negative.   HENT: Negative.    Eyes: Negative.   Respiratory: Negative.    Cardiovascular: Negative.   Gastrointestinal: Negative.   Endocrine: Negative.   Genitourinary: Negative.   Musculoskeletal:  Negative for myalgias.  Skin: Negative.   Allergic/Immunologic: Negative.   Neurological: Negative.   Hematological: Negative.   Psychiatric/Behavioral: Negative.       Today's Vitals   10/26/24 1416  BP: 114/80  Pulse: 86  Temp: 98.6 F (37 C)  TempSrc: Oral  Weight: 146 lb (66.2 kg)  Height: 5' 1 (1.549 m)  PainSc: 0-No  pain   Body mass index is 27.59 kg/m.  Wt Readings from Last 3 Encounters:  10/26/24 146 lb (66.2 kg)  11/19/23 141 lb (64 kg)  05/19/23 139 lb 12.8 oz (63.4 kg)     Objective:  Physical Exam Vitals and nursing note reviewed.  Constitutional:      General: She is not in acute distress.    Appearance: Normal appearance. She is  well-developed.  HENT:     Head: Normocephalic and atraumatic.     Right Ear: Hearing, tympanic membrane, ear canal and external ear normal. There is no impacted cerumen.     Left Ear: Hearing, tympanic membrane, ear canal and external ear normal. There is no impacted cerumen.     Nose: Nose normal.     Mouth/Throat:     Mouth: Mucous membranes are moist.  Eyes:     General: Lids are normal.     Extraocular Movements: Extraocular movements intact.     Conjunctiva/sclera: Conjunctivae normal.     Pupils: Pupils are equal, round, and reactive to light.     Funduscopic exam:    Right eye: No papilledema.        Left eye: No papilledema.  Neck:     Thyroid : No thyroid  mass.     Vascular: No carotid bruit.  Cardiovascular:     Rate and Rhythm: Normal rate and regular rhythm.     Pulses: Normal pulses.     Heart sounds: Normal heart sounds. No murmur heard. Pulmonary:     Effort: Pulmonary effort is normal. No respiratory distress.     Breath sounds: Normal breath sounds. No wheezing.  Chest:  Breasts:    Right: Normal. No mass or tenderness.     Left: Normal. No mass or tenderness.  Abdominal:     General: Abdomen is flat. Bowel sounds are normal. There is no distension.     Palpations: Abdomen is soft.     Tenderness: There is no abdominal tenderness.  Musculoskeletal:        General: No swelling or tenderness. Normal range of motion.     Cervical back: Full passive range of motion without pain, normal range of motion and neck supple.     Right lower leg: No edema.     Left lower leg: No edema.  Skin:    General: Skin is warm and dry.     Capillary Refill: Capillary refill takes less than 2 seconds.  Neurological:     General: No focal deficit present.     Mental Status: She is alert and oriented to person, place, and time.     Cranial Nerves: No cranial nerve deficit.     Sensory: No sensory deficit.     Motor: No weakness.  Psychiatric:        Mood and Affect: Mood  normal.        Behavior: Behavior normal.        Thought Content: Thought content normal.        Judgment: Judgment normal.         Assessment And Plan:     Encounter for annual health examination Assessment & Plan: Received COVID-19 and flu vaccinations. Scheduled for colonoscopy and mammogram. - Ensure completion of colonoscopy and mammogram. - Attend Medicare annual wellness visit on March 5th.  Orders: -     CBC -     CMP14+EGFR  Mixed hyperlipidemia Assessment & Plan: Cholesterol levels are normal, will not check labs today.  Encouraged  to eat a low fat diet. Increase fiber intake.   Orders: -     CBC -     CMP14+EGFR -     Lipid panel  Prediabetes Assessment & Plan: HgbA1c was up to 6 at last visit, continue focusing on healthy diet and regular exercise at least 150 minutes per week.   Orders: -     Hemoglobin A1c     Return for 1 year physical; 6 months f/u A1c and lipids.  Patient was given opportunity to ask questions. Patient verbalized understanding of the plan and was able to repeat key elements of the plan. All questions were answered to their satisfaction.   Dawn Ada, FNP  I, Dawn Ada, FNP, have reviewed all documentation for this visit. The documentation on 10/26/2024 for the exam, diagnosis, procedures, and orders are all accurate and complete.      [1]  Current Outpatient Medications:    Multiple Vitamins-Minerals (MULTIVITAMIN WOMEN 50+) TABS, Take 1 tablet by mouth daily., Disp: , Rfl:  [2] No Known Allergies [3]  Social History Tobacco Use  Smoking Status Never   Passive exposure: Past  Smokeless Tobacco Never

## 2024-10-26 NOTE — Assessment & Plan Note (Signed)
 Cholesterol levels are normal, will not check labs today.  Encouraged to eat a low fat diet. Increase fiber intake.

## 2024-10-26 NOTE — Assessment & Plan Note (Addendum)
 Received COVID-19 and flu vaccinations at the pharmacy.  Scheduled for colonoscopy for next month and mammogram for later in January. - Ensure completion of colonoscopy and mammogram. - Attend Medicare annual wellness visit on March 5th.

## 2024-10-26 NOTE — Patient Instructions (Signed)

## 2024-10-27 LAB — LIPID PANEL
Chol/HDL Ratio: 3.7 ratio (ref 0.0–4.4)
Cholesterol, Total: 198 mg/dL (ref 100–199)
HDL: 54 mg/dL
LDL Chol Calc (NIH): 120 mg/dL — ABNORMAL HIGH (ref 0–99)
Triglycerides: 136 mg/dL (ref 0–149)
VLDL Cholesterol Cal: 24 mg/dL (ref 5–40)

## 2024-10-27 LAB — CBC
Hematocrit: 38.9 % (ref 34.0–46.6)
Hemoglobin: 12.9 g/dL (ref 11.1–15.9)
MCH: 31.5 pg (ref 26.6–33.0)
MCHC: 33.2 g/dL (ref 31.5–35.7)
MCV: 95 fL (ref 79–97)
Platelets: 219 x10E3/uL (ref 150–450)
RBC: 4.09 x10E6/uL (ref 3.77–5.28)
RDW: 12.7 % (ref 11.7–15.4)
WBC: 6.1 x10E3/uL (ref 3.4–10.8)

## 2024-10-27 LAB — HEMOGLOBIN A1C
Est. average glucose Bld gHb Est-mCnc: 117 mg/dL
Hgb A1c MFr Bld: 5.7 % — ABNORMAL HIGH (ref 4.8–5.6)

## 2024-10-27 LAB — CMP14+EGFR
ALT: 17 IU/L (ref 0–32)
AST: 26 IU/L (ref 0–40)
Albumin: 4.1 g/dL (ref 3.8–4.8)
Alkaline Phosphatase: 82 IU/L (ref 49–135)
BUN/Creatinine Ratio: 15 (ref 12–28)
BUN: 13 mg/dL (ref 8–27)
Bilirubin Total: 0.3 mg/dL (ref 0.0–1.2)
CO2: 25 mmol/L (ref 20–29)
Calcium: 9.7 mg/dL (ref 8.7–10.3)
Chloride: 104 mmol/L (ref 96–106)
Creatinine, Ser: 0.85 mg/dL (ref 0.57–1.00)
Globulin, Total: 2.7 g/dL (ref 1.5–4.5)
Glucose: 88 mg/dL (ref 70–99)
Potassium: 4.5 mmol/L (ref 3.5–5.2)
Sodium: 141 mmol/L (ref 134–144)
Total Protein: 6.8 g/dL (ref 6.0–8.5)
eGFR: 71 mL/min/1.73

## 2024-11-11 ENCOUNTER — Ambulatory Visit
Admission: RE | Admit: 2024-11-11 | Discharge: 2024-11-11 | Disposition: A | Discharge status: Home / Self Care | Source: Ambulatory Visit | Attending: Internal Medicine | Admitting: Internal Medicine

## 2024-11-11 DIAGNOSIS — Z1231 Encounter for screening mammogram for malignant neoplasm of breast: Secondary | ICD-10-CM

## 2024-12-30 ENCOUNTER — Encounter: Payer: Self-pay | Admitting: Nurse Practitioner

## 2025-01-26 ENCOUNTER — Ambulatory Visit: Payer: Self-pay

## 2025-04-25 ENCOUNTER — Ambulatory Visit: Payer: Self-pay | Admitting: Nurse Practitioner

## 2025-10-31 ENCOUNTER — Encounter: Payer: Self-pay | Admitting: Nurse Practitioner
